# Patient Record
Sex: Male | Born: 1990
Health system: Southern US, Community
[De-identification: ages and names within clinical notes are randomized; demographics above are authoritative.]

## PROBLEM LIST (undated history)

## (undated) DIAGNOSIS — F329 Major depressive disorder, single episode, unspecified: Secondary | ICD-10-CM

## (undated) DIAGNOSIS — F0781 Postconcussional syndrome: Secondary | ICD-10-CM

## (undated) DIAGNOSIS — A072 Cryptosporidiosis: Secondary | ICD-10-CM

## (undated) DIAGNOSIS — F32A Depression, unspecified: Secondary | ICD-10-CM

## (undated) DIAGNOSIS — S0292XA Unspecified fracture of facial bones, initial encounter for closed fracture: Secondary | ICD-10-CM

## (undated) DIAGNOSIS — F17200 Nicotine dependence, unspecified, uncomplicated: Secondary | ICD-10-CM

## (undated) DIAGNOSIS — F419 Anxiety disorder, unspecified: Secondary | ICD-10-CM

## (undated) HISTORY — DX: Nicotine dependence, unspecified, uncomplicated: F17.200

## (undated) HISTORY — DX: Anxiety disorder, unspecified: F41.9

## (undated) HISTORY — DX: Postconcussional syndrome: F07.81

## (undated) HISTORY — DX: Cryptosporidiosis: A07.2

## (undated) HISTORY — DX: Unspecified fracture of facial bones, initial encounter for closed fracture: S02.92XA

## (undated) HISTORY — PX: NASAL SEPTUM SURGERY: SHX37

## (undated) HISTORY — DX: Depression, unspecified: F32.A

## (undated) HISTORY — DX: Major depressive disorder, single episode, unspecified: F32.9

## (undated) SURGERY — PERIPHERAL VASCULAR THROMBECTOMY
Anesthesia: Moderate Sedation | Laterality: Left

---
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
## 2011-03-01 ENCOUNTER — Ambulatory Visit: Payer: Self-pay | Admitting: Urology

## 2011-03-09 ENCOUNTER — Emergency Department: Payer: Self-pay | Admitting: *Deleted

## 2011-03-15 ENCOUNTER — Ambulatory Visit: Payer: Self-pay | Admitting: Pediatrics

## 2011-04-06 ENCOUNTER — Ambulatory Visit (INDEPENDENT_AMBULATORY_CARE_PROVIDER_SITE_OTHER): Payer: BC Managed Care – PPO | Admitting: Psychology

## 2011-04-06 DIAGNOSIS — F411 Generalized anxiety disorder: Secondary | ICD-10-CM

## 2011-04-06 DIAGNOSIS — F331 Major depressive disorder, recurrent, moderate: Secondary | ICD-10-CM

## 2011-04-13 ENCOUNTER — Ambulatory Visit (INDEPENDENT_AMBULATORY_CARE_PROVIDER_SITE_OTHER): Payer: BC Managed Care – PPO | Admitting: Psychology

## 2011-04-13 DIAGNOSIS — F331 Major depressive disorder, recurrent, moderate: Secondary | ICD-10-CM

## 2011-04-13 DIAGNOSIS — F411 Generalized anxiety disorder: Secondary | ICD-10-CM

## 2011-05-11 ENCOUNTER — Ambulatory Visit (INDEPENDENT_AMBULATORY_CARE_PROVIDER_SITE_OTHER): Payer: 59 | Admitting: Psychology

## 2011-05-11 DIAGNOSIS — F331 Major depressive disorder, recurrent, moderate: Secondary | ICD-10-CM

## 2011-05-11 DIAGNOSIS — F411 Generalized anxiety disorder: Secondary | ICD-10-CM

## 2011-05-21 ENCOUNTER — Encounter: Payer: Self-pay | Admitting: Family Medicine

## 2011-05-21 ENCOUNTER — Ambulatory Visit (INDEPENDENT_AMBULATORY_CARE_PROVIDER_SITE_OTHER): Payer: BC Managed Care – PPO | Admitting: Family Medicine

## 2011-05-21 VITALS — BP 116/78 | HR 60 | Temp 98.3°F | Ht 72.0 in | Wt 145.2 lb

## 2011-05-21 DIAGNOSIS — Z9189 Other specified personal risk factors, not elsewhere classified: Secondary | ICD-10-CM

## 2011-05-21 DIAGNOSIS — IMO0001 Reserved for inherently not codable concepts without codable children: Secondary | ICD-10-CM

## 2011-05-21 DIAGNOSIS — F32A Depression, unspecified: Secondary | ICD-10-CM

## 2011-05-21 DIAGNOSIS — F341 Dysthymic disorder: Secondary | ICD-10-CM

## 2011-05-21 DIAGNOSIS — F419 Anxiety disorder, unspecified: Secondary | ICD-10-CM

## 2011-05-21 DIAGNOSIS — F17200 Nicotine dependence, unspecified, uncomplicated: Secondary | ICD-10-CM

## 2011-05-21 DIAGNOSIS — J45909 Unspecified asthma, uncomplicated: Secondary | ICD-10-CM

## 2011-05-21 MED ORDER — ALPRAZOLAM 0.5 MG PO TABS: 0.5000 mg | ORAL_TABLET | Freq: Two times a day (BID) | ORAL | Status: DC | PRN

## 2011-05-21 MED ORDER — CITALOPRAM HYDROBROMIDE 20 MG PO TABS: 20.0000 mg | ORAL_TABLET | Freq: Every day | ORAL | Status: DC

## 2011-05-21 NOTE — Progress Notes (Addendum)
Subjective:    Patient ID: Brian Spence, male    DOB: [birth year], 20 y.o.   MRN: [id number]  HPI CC:new pt establish.  Referral from Dr. Laymond Purser for anxiety.  Previously saw [facility].  Have requested records.  Previously prescribed xanax for anxiety attacks.  Longstanding anxiety - "my whole life" gets nervous easily, diaphoretic, trouble talking to ppl.  + anxiety with going out in public, new places, new people.  + trouble sleeping, over thinks and worries.  When anxiety comes on can go into attack, this happening several times a day, worsening over last several weeks.  Had bad episode Monday - all day.  Trouble sleeping from Sunday night.  Feels restless, heart racing, sweating, chest feels tight, occasional SOB but not as much recently.  Xanax did help in past.  Also endorses longstanding depression.  Has always been shy, quiet.  Low energy level.  Trouble focusing at times.  No HI, + SI in passing but no plan, states would never go through with it.  Contracts for safety  Told has bipolar in past, placed on meds but this caused panic attacks.  Doesn't think this is the case.  States seeing Dr. Laymond Purser - they think more predominant anxiety disorder.  Denies episodes of mania, periods of time without need to sleep or with increased energy or more grandiose, irritable.  Previously saw Dr. Marguerite Olea (pediatrician).  Smoking - started 1 year ago, up to 1/2 ppd.  Currently sexually active, not protection 100% time, 7 partners in last year, currently states monogamous relationship.  No h/o STDs, states checked yearly, last this year 2012 and all negative per patient.  Freshman at [employer].  A's in 2 classes, had to drop 2 classes.  Medications and allergies reviewed and updated in chart.  Past histories reviewed and updated if relevant as below. There is no problem list on file for this patient.  Past Medical History  Diagnosis Date  . Asthma     exercise induced  . Anxiety and  depression    Past Surgical History  Procedure Date  . Shoulder surgery 2009    for seperated shoulder  . Knee arthroscopy w/ plica excision 2010    Right  . Meatotomy 2012    blockage  . Cystoscopy 2012   History  Substance Use Topics  . Smoking status: Current Everyday Smoker -- 0.5 packs/day    Types: Cigarettes  . Smokeless tobacco: Never Used  . Alcohol Use: Yes     Occasional   Family History  Problem Relation Age of Onset  . Schizophrenia Paternal Grandmother   . Bipolar disorder Mother     question  . Coronary artery disease Maternal Grandfather   . Cancer Neg Hx   . Stroke Neg Hx   . Diabetes Neg Hx   . Alcohol abuse Paternal Uncle    No Known Allergies No current outpatient prescriptions on file prior to visit.   Review of Systems  Constitutional: Negative for fever, chills, activity change, appetite change, fatigue and unexpected weight change.  HENT: Negative for hearing loss and neck pain.   Eyes: Negative for visual disturbance.  Respiratory: Positive for cough and chest tightness. Negative for shortness of breath and wheezing.   Cardiovascular: Negative for chest pain, palpitations and leg swelling.  Gastrointestinal: Negative for nausea, vomiting, abdominal pain, diarrhea, constipation, blood in stool and abdominal distention.  Genitourinary: Negative for hematuria and difficulty urinating.  Musculoskeletal: Negative for myalgias and arthralgias.  Skin: Negative  for rash.  Neurological: Positive for headaches (attributes to wisdom teeth). Negative for dizziness, seizures and syncope.  Hematological: Does not bruise/bleed easily.  Psychiatric/Behavioral: Positive for dysphoric mood. The patient is nervous/anxious.       Objective:   Physical Exam  Nursing note and vitals reviewed. Constitutional: He is oriented to person, place, and time. He appears well-developed and well-nourished. No distress.  HENT:  Head: Normocephalic and atraumatic.  Right  Ear: Hearing, tympanic membrane, external ear and ear canal normal.  Left Ear: Hearing, tympanic membrane, external ear and ear canal normal.  Nose: Nose normal. No mucosal edema or rhinorrhea.  Mouth/Throat: Uvula is midline, oropharynx is clear and moist and mucous membranes are normal. No oropharyngeal exudate, posterior oropharyngeal edema, posterior oropharyngeal erythema or tonsillar abscesses.  Eyes: Conjunctivae and EOM are normal. Pupils are equal, round, and reactive to light. No scleral icterus.  Neck: Normal range of motion. Neck supple. No thyromegaly present.  Cardiovascular: Normal rate, regular rhythm, normal heart sounds and intact distal pulses.   No murmur heard. Pulses:      Radial pulses are 2+ on the right side, and 2+ on the left side.  Pulmonary/Chest: Effort normal and breath sounds normal. No respiratory distress. He has no wheezes. He has no rales.  Abdominal: Soft. Bowel sounds are normal. He exhibits no distension and no mass. There is no tenderness. There is no rebound and no guarding.  Musculoskeletal: Normal range of motion.  Lymphadenopathy:    He has no cervical adenopathy.  Neurological: He is alert and oriented to person, place, and time.       CN grossly intact, station and gait intact  Skin: Skin is warm and dry. No rash noted.  Psychiatric: He has a normal mood and affect. His behavior is normal. Judgment and thought content normal.       Assessment & Plan:

## 2011-05-21 NOTE — Patient Instructions (Signed)
Check on meningitis shot as you may be due. questionairres today. I'd like to start you on medicine to help both anxiety and depression (mood issues). This medicine may make you feel worse first few weeks, so have your family keep an eye on you. Xanax while this medicine takes effect. Return to see me in 1 month for follow up. Good to meet you today, call us with questions. Best thing to do for your health is quit smoking.

## 2011-05-22 ENCOUNTER — Encounter: Payer: Self-pay | Admitting: Family Medicine

## 2011-05-22 DIAGNOSIS — IMO0001 Reserved for inherently not codable concepts without codable children: Secondary | ICD-10-CM | POA: Insufficient documentation

## 2011-05-22 DIAGNOSIS — F419 Anxiety disorder, unspecified: Secondary | ICD-10-CM | POA: Insufficient documentation

## 2011-05-22 DIAGNOSIS — J45990 Exercise induced bronchospasm: Secondary | ICD-10-CM | POA: Insufficient documentation

## 2011-05-22 DIAGNOSIS — Z87891 Personal history of nicotine dependence: Secondary | ICD-10-CM | POA: Insufficient documentation

## 2011-05-22 DIAGNOSIS — F32A Depression, unspecified: Secondary | ICD-10-CM | POA: Insufficient documentation

## 2011-05-22 NOTE — Assessment & Plan Note (Signed)
Due for Td 09/01/2015 Seems due for flu and meningitis shot. Advised to check with school if UTD on meningitis shot.  If not, will provide here.

## 2011-05-22 NOTE — Assessment & Plan Note (Signed)
Encouraged cessation.

## 2011-05-22 NOTE — Assessment & Plan Note (Addendum)
PHQ9 = 21/27, very difficult to function per pt GAD7 = 21/21, severe  MDQ = negative.  Only 4/13 for first part. Discussed importance of healthy coping strategies as well as treatment options including. Does seem more consistent with anxiety/depression than bipolar as no h/o manic episodes that I can tell.  Will treat as anxiety/depression with commencement of SSRI, use xanax temporary for anxiety attacks.  Discussed hesitance to use these types of meds long term 2/2 dependence, tolerance, side effects.  Will touch base with Dr. Laymond Purser as well. Discussed in detail SSRI use including side effects as well as chance of sxs worsening prior to improving, specifically possibility of worsening SI, and if that is the case to stop med and call me immediately.  Pt states lives with parents who can keep an eye on him for next several weeks.

## 2011-05-22 NOTE — Assessment & Plan Note (Signed)
Has inhaler at home, doesn't use.

## 2011-06-09 ENCOUNTER — Encounter: Payer: Self-pay | Admitting: Family Medicine

## 2011-06-21 ENCOUNTER — Ambulatory Visit (INDEPENDENT_AMBULATORY_CARE_PROVIDER_SITE_OTHER): Payer: BC Managed Care – PPO | Admitting: Family Medicine

## 2011-06-21 ENCOUNTER — Encounter: Payer: Self-pay | Admitting: Family Medicine

## 2011-06-21 VITALS — BP 112/74 | HR 60 | Temp 98.0°F | Wt 151.2 lb

## 2011-06-21 DIAGNOSIS — J069 Acute upper respiratory infection, unspecified: Secondary | ICD-10-CM | POA: Insufficient documentation

## 2011-06-21 DIAGNOSIS — F32A Depression, unspecified: Secondary | ICD-10-CM

## 2011-06-21 DIAGNOSIS — F329 Major depressive disorder, single episode, unspecified: Secondary | ICD-10-CM

## 2011-06-21 DIAGNOSIS — F410 Panic disorder [episodic paroxysmal anxiety] without agoraphobia: Secondary | ICD-10-CM | POA: Insufficient documentation

## 2011-06-21 DIAGNOSIS — F341 Dysthymic disorder: Secondary | ICD-10-CM

## 2011-06-21 MED ORDER — ALPRAZOLAM 0.5 MG PO TABS: 0.5000 mg | ORAL_TABLET | Freq: Two times a day (BID) | ORAL | Status: DC | PRN

## 2011-06-21 NOTE — Progress Notes (Signed)
  Subjective:    Patient ID: Brian Spence, male    DOB: 31-Dec-1990, 20 y.o.   MRN: [id number]  HPI CC: 1 mo f/u  19yo with longstanding anxiety and depression with panic attacks who was started previous visit on celexa and xanax prn presents for 1 mo f/u.  Feels anxiety/depression getting some better.  Has been taking celexa for 2+ wks only.  Out of xanax, ran out after 3 wks, was taking 0-3/day.  Short term anxiolytic effect noted.  Had increased stress in last few weeks, increased xanax use with this.  Last few weeks have been better.  No panic attacks in the last week.  Has used self calming techniques like slow breathing.  Working well.  Continues CBT with Dr. Laymond Purser.  To schedule f/u with her.  Having several week history of URTI sxs.  No fevers.  Cough productive of mucous, also sinus drainage.  No HA, tooth pain.  + ear discomfort occasionally.  Some chest discomfort with cough.  So far has tried nothing for this.  + ST as well.  Trying to rest, getting plenty of fluid.  Started 2 wks ago.  Girlfriend sick as well.  Congestion/cough worse in am and at night.  More head congestion currently.  Review of Systems Per HPI    Objective:   Physical Exam  Nursing note and vitals reviewed. Constitutional: He appears well-developed and well-nourished. No distress.  HENT:  Head: Normocephalic and atraumatic.  Right Ear: Hearing, tympanic membrane, external ear and ear canal normal.  Left Ear: Hearing, tympanic membrane, external ear and ear canal normal.  Nose: No mucosal edema or rhinorrhea. Right sinus exhibits maxillary sinus tenderness. Right sinus exhibits no frontal sinus tenderness. Left sinus exhibits maxillary sinus tenderness. Left sinus exhibits no frontal sinus tenderness.  Mouth/Throat: Uvula is midline, oropharynx is clear and moist and mucous membranes are normal. No oropharyngeal exudate, posterior oropharyngeal edema, posterior oropharyngeal erythema or tonsillar abscesses.       Mild sinus pressure with palpation Minimal PNDrainage  Eyes: Conjunctivae and EOM are normal. Pupils are equal, round, and reactive to light. No scleral icterus.  Neck: Normal range of motion. Neck supple.  Cardiovascular: Normal rate, regular rhythm, normal heart sounds and intact distal pulses.   No murmur heard. Pulmonary/Chest: Effort normal and breath sounds normal. No respiratory distress. He has no wheezes. He has no rales.  Lymphadenopathy:    He has no cervical adenopathy.  Neurological: He is alert.  Skin: Skin is warm and dry. No rash noted.  Psychiatric: He has a normal mood and affect. His behavior is normal. Judgment and thought content normal.       Assessment & Plan:

## 2011-06-21 NOTE — Assessment & Plan Note (Signed)
With some agoraphobia. Improved on celexa, xanax prn. See above.  RTC 6 wks for f/u.

## 2011-06-21 NOTE — Patient Instructions (Signed)
Continue celexa at 20mg  for now.  Let me know if in 2 weeks I'm glad things are going well. Return in 4-6 weeks for follow up I think you have viral upper respiratory infection. Try simple mucinex with plenty of fluid to help mobilize mucous out. Let me know if fever >101, worsening cough or trouble breathing, or worsening sinus congestion.

## 2011-06-21 NOTE — Assessment & Plan Note (Signed)
Going on about 2wks. Could be early developing bronchitis,sinusitis but exam benign today. Good O2 sat, no wheezing on lung exam today. supportive care for now, to call me if not improving as expected, fever >101, or worsening cough/sinus HA.

## 2011-06-21 NOTE — Assessment & Plan Note (Signed)
Improved on celexa QD, xanax prn. Discussed to give SSRI more time as only has been on this 2+ wks. overall doing well. PHQ9 = 7 (from 21 last visit), somewhat difficult to function. GAD7 = 10 (from 21 last visit) Continue CBT with Dr. Laymond Purser. Encouraged try to minimize xanax use.

## 2011-08-27 DIAGNOSIS — F0781 Postconcussional syndrome: Secondary | ICD-10-CM

## 2011-08-27 HISTORY — DX: Postconcussional syndrome: F07.81

## 2011-09-11 ENCOUNTER — Emergency Department: Payer: Self-pay | Admitting: Emergency Medicine

## 2011-09-14 ENCOUNTER — Ambulatory Visit (INDEPENDENT_AMBULATORY_CARE_PROVIDER_SITE_OTHER)
Admission: RE | Admit: 2011-09-14 | Discharge: 2011-09-14 | Disposition: A | Discharge status: Home / Self Care | Payer: BC Managed Care – PPO | Source: Ambulatory Visit | Attending: Family Medicine | Admitting: Family Medicine

## 2011-09-14 ENCOUNTER — Encounter: Payer: Self-pay | Admitting: Family Medicine

## 2011-09-14 ENCOUNTER — Ambulatory Visit (INDEPENDENT_AMBULATORY_CARE_PROVIDER_SITE_OTHER): Payer: BC Managed Care – PPO | Admitting: Family Medicine

## 2011-09-14 VITALS — BP 122/76 | HR 72 | Temp 98.7°F | Wt 148.0 lb

## 2011-09-14 DIAGNOSIS — M79645 Pain in left finger(s): Secondary | ICD-10-CM

## 2011-09-14 DIAGNOSIS — M79609 Pain in unspecified limb: Secondary | ICD-10-CM

## 2011-09-14 DIAGNOSIS — S060XAA Concussion with loss of consciousness status unknown, initial encounter: Secondary | ICD-10-CM

## 2011-09-14 DIAGNOSIS — F341 Dysthymic disorder: Secondary | ICD-10-CM

## 2011-09-14 DIAGNOSIS — F419 Anxiety disorder, unspecified: Secondary | ICD-10-CM

## 2011-09-14 DIAGNOSIS — S060X9A Concussion with loss of consciousness of unspecified duration, initial encounter: Secondary | ICD-10-CM

## 2011-09-14 DIAGNOSIS — R109 Unspecified abdominal pain: Secondary | ICD-10-CM | POA: Insufficient documentation

## 2011-09-14 DIAGNOSIS — F0781 Postconcussional syndrome: Secondary | ICD-10-CM | POA: Insufficient documentation

## 2011-09-14 DIAGNOSIS — F32A Depression, unspecified: Secondary | ICD-10-CM

## 2011-09-14 MED ORDER — NAPROXEN 500 MG PO TABS: ORAL_TABLET | ORAL | Status: DC

## 2011-09-14 MED ORDER — CYCLOBENZAPRINE HCL 5 MG PO TABS: 5.0000 mg | ORAL_TABLET | Freq: Two times a day (BID) | ORAL | Status: DC | PRN

## 2011-09-14 MED ORDER — CITALOPRAM HYDROBROMIDE 20 MG PO TABS: 20.0000 mg | ORAL_TABLET | Freq: Every day | ORAL | Status: DC

## 2011-09-14 MED ORDER — ALPRAZOLAM 0.5 MG PO TABS: 0.5000 mg | ORAL_TABLET | Freq: Two times a day (BID) | ORAL | Status: DC | PRN

## 2011-09-14 NOTE — Assessment & Plan Note (Signed)
Check H pylori, consider rec trial H2 blocker vs PPI
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
## 2011-09-14 NOTE — Assessment & Plan Note (Signed)
Mild.  Continued headache and mild dizziness.  CT head from ER reviewed.  Neurological exam benign today. Discussed brain rest for next few days, slowly increasing activity as tolerated.

## 2011-09-14 NOTE — Assessment & Plan Note (Signed)
Refilled celexa x 6 mo, rtc 4 mo for f/u. Refilled xanax, avised to use sparingly, hopeful to come off with better anxiety control.
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
## 2011-09-22 ENCOUNTER — Encounter: Payer: Self-pay | Admitting: *Deleted

## 2011-09-22 ENCOUNTER — Ambulatory Visit (INDEPENDENT_AMBULATORY_CARE_PROVIDER_SITE_OTHER): Payer: BC Managed Care – PPO | Admitting: Family Medicine

## 2011-09-22 ENCOUNTER — Encounter: Payer: Self-pay | Admitting: Family Medicine

## 2011-09-22 DIAGNOSIS — R109 Unspecified abdominal pain: Secondary | ICD-10-CM

## 2011-09-22 DIAGNOSIS — F0781 Postconcussional syndrome: Secondary | ICD-10-CM

## 2011-09-22 NOTE — Assessment & Plan Note (Signed)
Sounds more like bloating/indigestion and gas. Discussed foods to avoid as well as start trial of gas x with meals, zantac as well. Update me if not improving H pylori negative last visit 08/2011.
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
## 2011-09-22 NOTE — Assessment & Plan Note (Signed)
Manifesting as nausea/dizziness and some impaired memory. DOI 09/11/2011.  Head CT WNL at [facility] ER. No HA to be concerned for subdural. Reassured, continue brain rest and physical activity rest. rtc 2-3 wks for f/u.  If not improving, consder referral to neurology.

## 2011-09-22 NOTE — Patient Instructions (Addendum)
I think you have post concussion syndrome. Rest brain - no physical activity for 1 week.  No significant TV more than 1-2 hours/day, no studying, not a lot of cellphone or texting or video games.   Then slowly build up activity. For abdomen - trial of gas x with meals and trial of znatac 150mg  twice daily - both are over the counter. Exclude gas producing foods (beans, onions, celery, carrots, raisins, bananas, apricots, prunes, brussel sprouts, wheat germ, pretzels) Consider trail of lactose free diet (back off milk and cheese) for abdomen. Return to see me in 2-3 weeks for follow up. Out of work for 1 week then light duty (no heavy lifting) for 1 week.  Post-Concussion Syndrome Post-concussion syndrome describes the symptoms that can occur after a head injury. These symptoms can last from weeks to months. CAUSES  It is not clear why some head injuries cause post-concussion syndrome. It can occur whether your head injury was mild or severe and whether you were wearing head protection or not.  SYMPTOMS  Memory difficulties.   Dizziness.   Headaches.   Double vision or blurry vision.   Sensitivity to light.   Hearing difficulties.   Depression.   Tiredness.   Weakness.   Difficulty with concentration.   Difficulty sleeping or staying asleep.   Vomiting.  DIAGNOSIS  There is no test to determine whether you have post-concussion syndrome. Your caregiver may order an imaging scan of your brain, such as a CT scan, to check for other problems that may be causing your symptoms (such as severe injury inside your skull). TREATMENT  Usually, these problems disappear over time without medical care. Your caregiver may prescribe medicine to help ease your symptoms. It is important to follow up with a neurologist to evaluate your recovery and address any lingering symptoms or issues. HOME CARE INSTRUCTIONS   Only take over-the-counter or prescription medicines for pain, discomfort, or  fever as directed by your caregiver. Do not take aspirin. Aspirin can slow blood clotting.   Sleep with your head slightly elevated to help with headaches.   Avoid any situation where there is potential for another head injury (football, hockey, martial arts, horseback riding). Your condition will get worse every time you experience a concussion. You should avoid these activities until you are evaluated by the appropriate follow-up caregivers.   Keep all follow-up appointments as directed by your caregiver.  SEEK IMMEDIATE MEDICAL CARE IF:  You develop confusion or unusual drowsiness.   You cannot wake the injured person.   You develop nausea or persistent, forceful vomiting.   You feel like you are moving when you are not (vertigo).   You notice the injured person's eyes moving rapidly back and forth. This may be a sign of vertigo.   You have convulsions or faint.   You have severe, persistent headaches that are not relieved by medicine.   You cannot use your arms or legs normally.   Your pupils change size.   You have clear or bloody discharge from the nose or ears.   Your problems are getting worse, not better.  MAKE SURE YOU:  Understand these instructions.   Will watch your condition.   Will get help right away if you are not doing well or get worse.  Document Released: 01/01/2002 Document Revised: 03/24/2011 Document Reviewed: 01/28/2011 [facility] Patient Information 2012 [place], [place].

## 2012-03-17 ENCOUNTER — Ambulatory Visit (INDEPENDENT_AMBULATORY_CARE_PROVIDER_SITE_OTHER): Payer: BC Managed Care – PPO | Admitting: Family Medicine

## 2012-03-17 ENCOUNTER — Encounter: Payer: Self-pay | Admitting: Family Medicine

## 2012-03-17 VITALS — BP 116/78 | HR 84 | Temp 98.2°F | Wt 144.8 lb

## 2012-03-17 DIAGNOSIS — F0781 Postconcussional syndrome: Secondary | ICD-10-CM

## 2012-03-17 DIAGNOSIS — G4484 Primary exertional headache: Secondary | ICD-10-CM

## 2012-03-17 DIAGNOSIS — R079 Chest pain, unspecified: Secondary | ICD-10-CM

## 2012-03-17 DIAGNOSIS — R51 Headache: Secondary | ICD-10-CM

## 2012-03-17 DIAGNOSIS — R55 Syncope and collapse: Secondary | ICD-10-CM | POA: Insufficient documentation

## 2012-03-17 DIAGNOSIS — F410 Panic disorder [episodic paroxysmal anxiety] without agoraphobia: Secondary | ICD-10-CM

## 2012-03-17 MED ORDER — ALPRAZOLAM 0.25 MG PO TABS: 0.2500 mg | ORAL_TABLET | Freq: Two times a day (BID) | ORAL | Status: DC | PRN

## 2012-03-17 MED ORDER — CITALOPRAM HYDROBROMIDE 10 MG PO TABS: 10.0000 mg | ORAL_TABLET | Freq: Every day | ORAL | Status: DC

## 2012-03-17 NOTE — Patient Instructions (Addendum)
EKG overall looking ok. I'd like to start you back on celexa - start at 10mg  daily - sent in refill. Xanax for next three months while celexa achieves good level. Pass by up front to schedule appointment with neurology. avoid exhertion until you see neurologist.
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
## 2012-03-17 NOTE — Assessment & Plan Note (Signed)
Start celexa at 10mg  daily, restart xanax prn panic attacks. Discussed again temporary use of xanax, hopeful to stop within 3 mo.
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
## 2012-03-17 NOTE — Assessment & Plan Note (Signed)
See above

## 2012-03-17 NOTE — Assessment & Plan Note (Signed)
EKG today overall stable.  Doubt cardiac issue. EKG - sinus arrhythmia at rate of 60-70s, normal axis, intervals, no ST/T changes

## 2012-04-04 ENCOUNTER — Ambulatory Visit: Payer: Self-pay | Admitting: Neurology

## 2012-07-31 ENCOUNTER — Encounter: Payer: Self-pay | Admitting: Family Medicine

## 2012-07-31 ENCOUNTER — Ambulatory Visit (INDEPENDENT_AMBULATORY_CARE_PROVIDER_SITE_OTHER): Payer: BC Managed Care – PPO | Admitting: Family Medicine

## 2012-07-31 ENCOUNTER — Ambulatory Visit (INDEPENDENT_AMBULATORY_CARE_PROVIDER_SITE_OTHER)
Admission: RE | Admit: 2012-07-31 | Discharge: 2012-07-31 | Disposition: A | Discharge status: Home / Self Care | Payer: BC Managed Care – PPO | Source: Ambulatory Visit | Attending: Family Medicine | Admitting: Family Medicine

## 2012-07-31 VITALS — BP 112/72 | HR 50 | Temp 98.0°F | Wt 151.8 lb

## 2012-07-31 DIAGNOSIS — S6991XA Unspecified injury of right wrist, hand and finger(s), initial encounter: Secondary | ICD-10-CM

## 2012-07-31 DIAGNOSIS — F32A Depression, unspecified: Secondary | ICD-10-CM

## 2012-07-31 DIAGNOSIS — F341 Dysthymic disorder: Secondary | ICD-10-CM

## 2012-07-31 DIAGNOSIS — F419 Anxiety disorder, unspecified: Secondary | ICD-10-CM

## 2012-07-31 DIAGNOSIS — S6990XA Unspecified injury of unspecified wrist, hand and finger(s), initial encounter: Secondary | ICD-10-CM

## 2012-07-31 MED ORDER — CITALOPRAM HYDROBROMIDE 10 MG PO TABS: 10.0000 mg | ORAL_TABLET | Freq: Every day | ORAL | Status: DC

## 2012-07-31 MED ORDER — ALPRAZOLAM 0.25 MG PO TABS: 0.2500 mg | ORAL_TABLET | Freq: Two times a day (BID) | ORAL | Status: AC | PRN

## 2012-07-31 NOTE — Assessment & Plan Note (Signed)
Requests to restart celexa and xanax while starting SSRI. Refilled both.

## 2012-07-31 NOTE — Assessment & Plan Note (Signed)
Concern given h/o finger fracture 5 yrs ago. Checked xrays today of finger and hand - no evident acute fracture. Anticipate R 5th PIP sprain. Supportive care as per instructions - recommended no heavy lifting for next week, may use pain med like ibuprofen, ice and elevate hand. If not much improved in 2 wks, recommended return for re eval.
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
## 2012-07-31 NOTE — Patient Instructions (Addendum)
xrays today. Let's continue using buddy tape. No heavy lifting for next 1 week. Ice hand. Use aleve or ibuprofen for hand swelling.

## 2012-12-07 ENCOUNTER — Encounter: Payer: Self-pay | Admitting: Family Medicine

## 2012-12-07 ENCOUNTER — Ambulatory Visit (INDEPENDENT_AMBULATORY_CARE_PROVIDER_SITE_OTHER): Payer: BC Managed Care – PPO | Admitting: Family Medicine

## 2012-12-07 VITALS — BP 118/78 | HR 60 | Temp 98.1°F | Wt 157.0 lb

## 2012-12-07 DIAGNOSIS — M79609 Pain in unspecified limb: Secondary | ICD-10-CM

## 2012-12-07 DIAGNOSIS — M79602 Pain in left arm: Secondary | ICD-10-CM | POA: Insufficient documentation

## 2012-12-07 DIAGNOSIS — F32A Depression, unspecified: Secondary | ICD-10-CM

## 2012-12-07 DIAGNOSIS — F329 Major depressive disorder, single episode, unspecified: Secondary | ICD-10-CM

## 2012-12-07 DIAGNOSIS — J45990 Exercise induced bronchospasm: Secondary | ICD-10-CM

## 2012-12-07 DIAGNOSIS — F341 Dysthymic disorder: Secondary | ICD-10-CM

## 2012-12-07 MED ORDER — ALBUTEROL SULFATE HFA 108 (90 BASE) MCG/ACT IN AERS: 2.0000 | INHALATION_SPRAY | Freq: Four times a day (QID) | RESPIRATORY_TRACT | Status: DC | PRN

## 2012-12-07 MED ORDER — CITALOPRAM HYDROBROMIDE 10 MG PO TABS: 10.0000 mg | ORAL_TABLET | Freq: Every day | ORAL | Status: DC
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
## 2012-12-07 NOTE — Assessment & Plan Note (Signed)
With h/o panic attacks.  Restart celexa.

## 2012-12-07 NOTE — Assessment & Plan Note (Signed)
L elbow- anticipate golfer's elbow. Discussed this. Treat with NSAID and exercises from [facility] pt advisor on medial epicondylitis. L shoulder - anticipate chronic RTC injury, likely subscapularis strain.  Treat with NSAIDs, ice, and exercises from [facility] pt advisor on RTC injury. To return for further eval if not improving as expected or any worsening

## 2012-12-07 NOTE — Patient Instructions (Signed)
Albuterol refilled. Restart celexa for anxiety. I think you have medial epicondylitis and some inflammation of left rotator cuff (exercises provided) Ice to shoulder Use ibupofen 400mg  twice daily with food for next 5 days then only as needed. Back off heavy lifting for the next 1-2 weeks while you're doing exercises provided today.

## 2012-12-07 NOTE — Assessment & Plan Note (Signed)
Refilled albuterol 

## 2013-01-09 ENCOUNTER — Ambulatory Visit (INDEPENDENT_AMBULATORY_CARE_PROVIDER_SITE_OTHER): Payer: BC Managed Care – PPO | Admitting: Family Medicine

## 2013-01-09 ENCOUNTER — Encounter: Payer: Self-pay | Admitting: Family Medicine

## 2013-01-09 VITALS — BP 114/78 | HR 72 | Temp 98.2°F | Wt 153.8 lb

## 2013-01-09 DIAGNOSIS — F341 Dysthymic disorder: Secondary | ICD-10-CM

## 2013-01-09 DIAGNOSIS — F419 Anxiety disorder, unspecified: Secondary | ICD-10-CM

## 2013-01-09 DIAGNOSIS — F32A Depression, unspecified: Secondary | ICD-10-CM

## 2013-01-09 MED ORDER — SERTRALINE HCL 25 MG PO TABS: 25.0000 mg | ORAL_TABLET | Freq: Every day | ORAL | Status: DC

## 2013-01-09 NOTE — Assessment & Plan Note (Signed)
Anticipate some of today's sxs due to celexa - will stop and change over to sertaline. Discussed taper with patient who agrees with plan. Return as needed, update me if sxs continue.
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
## 2013-01-09 NOTE — Patient Instructions (Signed)
I wonder if celexa is causing some of these symptoms. Let's stop celexa today.  May start sertraline 25mg  daily tomorrow. Give me an update with how you feel on the new medicine.

## 2013-01-10 ENCOUNTER — Telehealth: Payer: Self-pay

## 2013-01-10 NOTE — Telephone Encounter (Signed)
Dr Para March said for pt to stop Zoloft; forward note to Dr Sharen Hones and Dr Sharen Hones assistant will call back with further instructions(could be tomorrow or this afternoon). Advised pt if condition changes or worsens to go to ED for evaluation; pt said he did not know if he needed to go to ED or not; pt said pain in head was not as bad as when started 30 mins ago; pain level now 7. Pt is at work and will wait awhile and decide if needs eval today; if so pt will go to ED. Pt has no weakness in arms or legs and no speech problem. Pt said rt arm at elbow is cramping but has had that before. Again advised pt to stop zoloft and if condition changes or worsens to go to ED. Pt voiced understanding.

## 2013-01-10 NOTE — Telephone Encounter (Signed)
Pt calling after starting zoloft today at 1 pm; pt said 30 mins ago started with severe pain on back of head right side,dizziness,slight SOB,blurred vision. No chest pain. Pt wants to know if could be side effect to zoloft. Should pt continue taking zoloft.

## 2013-01-10 NOTE — Telephone Encounter (Signed)
Noted, will route to PCP.  Thanks to all.

## 2013-01-11 NOTE — Telephone Encounter (Signed)
Agree to stop zoloft - we may re-try celexa at a lower dose, or we may try 3rd antidepressant from a different class (effexor).

## 2013-01-11 NOTE — Telephone Encounter (Signed)
Attempted to call patient. No answer and no VM. Will try again later.

## 2013-01-16 NOTE — Telephone Encounter (Signed)
Spoke with patient. He is out of town right now and said he would think about what he wants to do and call back with a decision when he gets back in town probably next week.

## 2013-02-08 ENCOUNTER — Encounter: Payer: Self-pay | Admitting: Family Medicine

## 2013-02-08 ENCOUNTER — Ambulatory Visit (INDEPENDENT_AMBULATORY_CARE_PROVIDER_SITE_OTHER): Payer: BC Managed Care – PPO | Admitting: Family Medicine

## 2013-02-08 VITALS — BP 110/78 | HR 80 | Temp 98.4°F | Wt 153.5 lb

## 2013-02-08 DIAGNOSIS — J45990 Exercise induced bronchospasm: Secondary | ICD-10-CM

## 2013-02-08 DIAGNOSIS — F32A Depression, unspecified: Secondary | ICD-10-CM

## 2013-02-08 DIAGNOSIS — F419 Anxiety disorder, unspecified: Secondary | ICD-10-CM

## 2013-02-08 DIAGNOSIS — F329 Major depressive disorder, single episode, unspecified: Secondary | ICD-10-CM

## 2013-02-08 DIAGNOSIS — R55 Syncope and collapse: Secondary | ICD-10-CM

## 2013-02-08 DIAGNOSIS — F341 Dysthymic disorder: Secondary | ICD-10-CM

## 2013-02-08 MED ORDER — VENLAFAXINE HCL ER 37.5 MG PO CP24: 37.5000 mg | ORAL_CAPSULE | Freq: Every day | ORAL | Status: DC

## 2013-02-08 MED ORDER — HYDROXYZINE HCL 25 MG PO TABS: 25.0000 mg | ORAL_TABLET | Freq: Three times a day (TID) | ORAL | Status: DC | PRN
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
## 2013-02-08 NOTE — Patient Instructions (Addendum)
EKG today. Blood work today. Prescription for low dose effexor to try when you're at home for several week trial to see if anxiety improves. Return 1 month after starting effexor. May use hydroxyzine 1/2 to 1 tablet as needed for anxiety attack. Good to see you today, call us with questions.

## 2013-02-09 ENCOUNTER — Encounter: Payer: Self-pay | Admitting: Family Medicine

## 2013-02-09 LAB — BASIC METABOLIC PANEL
Calcium: 9.2 mg/dL (ref 8.4–10.5)
GFR: 110.84 mL/min (ref >60.00)
Potassium: 4.1 mEq/L (ref 3.5–5.1)
Sodium: 137 mEq/L (ref 135–145)

## 2013-02-09 LAB — CBC WITH DIFFERENTIAL/PLATELET
Basophils Absolute: 0.1 10*3/uL (ref 0.0–0.1)
Basophils Relative: 0.9 % (ref 0.0–3.0)
Eosinophils Relative: 1.7 % (ref 0.0–5.0)
Hemoglobin: 14.5 g/dL (ref 13.0–17.0)
Lymphocytes Relative: 33.3 % (ref 12.0–46.0)
Monocytes Relative: 9.9 % (ref 3.0–12.0)
Neutro Abs: 3.7 10*3/uL (ref 1.4–7.7)
RBC: 4.58 Mil/uL (ref 4.22–5.81)
RDW: 13.1 % (ref 11.5–14.6)
WBC: 6.9 10*3/uL (ref 4.5–10.5)

## 2013-02-09 LAB — TSH: TSH: 1.13 u[IU]/mL (ref 0.35–5.50)

## 2013-02-09 NOTE — Assessment & Plan Note (Addendum)
Rechecked EKG today.  Prodromal phase prior to syncope - ?vasovagal.  EKG: NSR rate 50s, normal axis, intervals, no hypertrophy or acute ST/T changes, unchanged from EKG 2013. Possibly anxiety related, vs other.   If persistent, refer to cards for further evaluation. Check reversible blood work today (TSH, CBC, BMP)

## 2013-02-09 NOTE — Assessment & Plan Note (Signed)
Rare albuterol use

## 2013-02-09 NOTE — Assessment & Plan Note (Addendum)
Recently worsening with anxiety attacks. Has not tolerated celexa or zoloft so far.   Recommended trial of effexor - may start when he returns from work. Has declined f/u with Dr. Laymond Purser. Provided today with atarax prn anxiety - has been on xanax in past.

## 2013-02-12 ENCOUNTER — Encounter: Payer: Self-pay | Admitting: *Deleted

## 2013-02-16 ENCOUNTER — Encounter: Payer: Self-pay | Admitting: Family Medicine

## 2013-02-16 ENCOUNTER — Ambulatory Visit (INDEPENDENT_AMBULATORY_CARE_PROVIDER_SITE_OTHER): Payer: BC Managed Care – PPO | Admitting: Family Medicine

## 2013-02-16 ENCOUNTER — Encounter: Payer: Self-pay | Admitting: *Deleted

## 2013-02-16 VITALS — BP 116/64 | HR 68 | Temp 98.1°F | Wt 151.8 lb

## 2013-02-16 DIAGNOSIS — IMO0002 Reserved for concepts with insufficient information to code with codable children: Secondary | ICD-10-CM

## 2013-02-16 DIAGNOSIS — F419 Anxiety disorder, unspecified: Secondary | ICD-10-CM

## 2013-02-16 DIAGNOSIS — F341 Dysthymic disorder: Secondary | ICD-10-CM

## 2013-02-16 DIAGNOSIS — T304 Corrosion of unspecified body region, unspecified degree: Secondary | ICD-10-CM | POA: Insufficient documentation

## 2013-02-16 DIAGNOSIS — T300 Burn of unspecified body region, unspecified degree: Secondary | ICD-10-CM

## 2013-02-16 DIAGNOSIS — F32A Depression, unspecified: Secondary | ICD-10-CM

## 2013-02-16 MED ORDER — BACITRACIN-NEOMYCIN-POLYMYXIN 400-5-5000 EX OINT: TOPICAL_OINTMENT | Freq: Two times a day (BID) | CUTANEOUS | Status: DC

## 2013-02-16 MED ORDER — BUSPIRONE HCL 5 MG PO TABS: 5.0000 mg | ORAL_TABLET | Freq: Every day | ORAL | Status: DC

## 2013-02-16 MED ORDER — ALBUTEROL SULFATE HFA 108 (90 BASE) MCG/ACT IN AERS: 2.0000 | INHALATION_SPRAY | Freq: Four times a day (QID) | RESPIRATORY_TRACT | Status: DC | PRN
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
## 2013-02-16 NOTE — Assessment & Plan Note (Signed)
Has not started venlafaxine hydroxyzine not effective for prn anxiety. Will do trial of buspar, stop venlafaxine. Start low given prior sensitivity to meds. Update if not responding, would likely restart venlafaxine and PRN benzo
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
## 2013-03-27 ENCOUNTER — Telehealth: Payer: Self-pay

## 2013-04-20 ENCOUNTER — Encounter: Payer: Self-pay | Admitting: Family Medicine

## 2013-04-20 ENCOUNTER — Ambulatory Visit (INDEPENDENT_AMBULATORY_CARE_PROVIDER_SITE_OTHER): Payer: BC Managed Care – PPO | Admitting: Family Medicine

## 2013-04-20 VITALS — BP 110/76 | HR 76 | Temp 98.2°F | Ht 72.0 in | Wt 161.2 lb

## 2013-04-20 DIAGNOSIS — J020 Streptococcal pharyngitis: Secondary | ICD-10-CM

## 2013-04-20 DIAGNOSIS — J029 Acute pharyngitis, unspecified: Secondary | ICD-10-CM

## 2013-04-20 MED ORDER — AMOXICILLIN 500 MG PO CAPS: 1000.0000 mg | ORAL_CAPSULE | Freq: Two times a day (BID) | ORAL | Status: DC

## 2013-04-20 NOTE — Progress Notes (Signed)
  Subjective:    Patient ID: Brian Spence, male    DOB: 1990/12/13, 22 y.o.   MRN: [id number]  Sore Throat  This is a new problem. The current episode started in the past 7 days (2-3). The problem has been gradually worsening. Neither side of throat is experiencing more pain than the other. There has been no fever. The pain is moderate. Associated symptoms include a plugged ear sensation. Pertinent negatives include no drooling, shortness of breath or trouble swallowing. Associated symptoms comments: Right ear  post nasal drip, fatigued, decreased appetite. He has had no exposure to strep or mono. He has tried nothing for the symptoms. The treatment provided no relief.   Hx of exercise induced asthma.. Has use albuterol occ with this   He smokes ecigarettes now, former smoker   Review of Systems  HENT: Negative for drooling and trouble swallowing.   Respiratory: Negative for shortness of breath.        Objective:   Physical Exam  Constitutional: Vital signs are normal. He appears well-developed and well-nourished.  Non-toxic appearance. He does not appear ill. No distress.  HENT:  Head: Normocephalic and atraumatic.  Right Ear: Hearing, tympanic membrane, external ear and ear canal normal. No tenderness. No foreign bodies. Tympanic membrane is not retracted and not bulging.  Left Ear: Hearing, tympanic membrane, external ear and ear canal normal. No tenderness. No foreign bodies. Tympanic membrane is not retracted and not bulging.  Nose: Nose normal. No mucosal edema or rhinorrhea. Right sinus exhibits no maxillary sinus tenderness and no frontal sinus tenderness. Left sinus exhibits no maxillary sinus tenderness and no frontal sinus tenderness.  Mouth/Throat: Uvula is midline and mucous membranes are normal. Normal dentition. No dental caries. Oropharyngeal exudate, posterior oropharyngeal edema and posterior oropharyngeal erythema present. No tonsillar abscesses.  Eyes:  Conjunctivae, EOM and lids are normal. Pupils are equal, round, and reactive to light. Lids are everted and swept, no foreign bodies found.  Neck: Trachea normal, normal range of motion and phonation normal. Neck supple. Carotid bruit is not present. No mass and no thyromegaly present.  Cardiovascular: Normal rate, regular rhythm, S1 normal, S2 normal, normal heart sounds, intact distal pulses and normal pulses.  Exam reveals no gallop.   No murmur heard. Pulmonary/Chest: Effort normal and breath sounds normal. No respiratory distress. He has no wheezes. He has no rhonchi. He has no rales.  Abdominal: Soft. Normal appearance and bowel sounds are normal. There is no hepatosplenomegaly. There is no tenderness. There is no rebound, no guarding and no CVA tenderness. No hernia.  Neurological: He is alert. He has normal reflexes.  Skin: Skin is warm, dry and intact. No rash noted.  Psychiatric: He has a normal mood and affect. His speech is normal and behavior is normal. Judgment normal.          Assessment & Plan:

## 2013-04-20 NOTE — Assessment & Plan Note (Signed)
Treat with antibiotics. Symptomatic care. Call if not improving in 48 hours... Go to ER if airway compromise.

## 2013-04-20 NOTE — Patient Instructions (Addendum)
Complete antibiotics. You will remain contagious until 24 hours of the antibiotics. Ibuprofen 800 mg every 8 hours for pain or fever. Call if throat not improving some after 48 hours of antibiotics , new fever or if pain increases on one side versus the other. Call sooner if worsening. Go to ER if you cannot breath or if throat feels like swelling is worsening.

## 2013-05-01 ENCOUNTER — Encounter: Payer: Self-pay | Admitting: Family Medicine

## 2013-05-01 ENCOUNTER — Encounter: Payer: Self-pay | Admitting: *Deleted

## 2013-05-01 ENCOUNTER — Ambulatory Visit (INDEPENDENT_AMBULATORY_CARE_PROVIDER_SITE_OTHER): Payer: BC Managed Care – PPO | Admitting: Family Medicine

## 2013-05-01 VITALS — BP 118/64 | HR 68 | Temp 98.2°F | Wt 166.5 lb

## 2013-05-01 DIAGNOSIS — W57XXXA Bitten or stung by nonvenomous insect and other nonvenomous arthropods, initial encounter: Secondary | ICD-10-CM | POA: Insufficient documentation

## 2013-05-01 DIAGNOSIS — J029 Acute pharyngitis, unspecified: Secondary | ICD-10-CM

## 2013-05-01 DIAGNOSIS — F32A Depression, unspecified: Secondary | ICD-10-CM

## 2013-05-01 DIAGNOSIS — F341 Dysthymic disorder: Secondary | ICD-10-CM

## 2013-05-01 DIAGNOSIS — T148 Other injury of unspecified body region: Secondary | ICD-10-CM

## 2013-05-01 DIAGNOSIS — J019 Acute sinusitis, unspecified: Secondary | ICD-10-CM | POA: Insufficient documentation

## 2013-05-01 DIAGNOSIS — F329 Major depressive disorder, single episode, unspecified: Secondary | ICD-10-CM

## 2013-05-01 LAB — POCT RAPID STREP A (OFFICE): Rapid Strep A Screen: NEGATIVE

## 2013-05-01 MED ORDER — GUAIFENESIN-CODEINE 100-10 MG/5ML PO SYRP: 5.0000 mL | ORAL_SOLUTION | Freq: Every evening | ORAL | Status: DC | PRN

## 2013-05-01 MED ORDER — BUSPIRONE HCL 5 MG PO TABS: 5.0000 mg | ORAL_TABLET | Freq: Every day | ORAL | Status: DC

## 2013-05-01 MED ORDER — AZITHROMYCIN 250 MG PO TABS: ORAL_TABLET | ORAL | Status: DC

## 2013-05-01 NOTE — Assessment & Plan Note (Signed)
buspar working well - refilled.
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
## 2013-05-01 NOTE — Patient Instructions (Signed)
I think you have sinus infection on top of recently treated strep throat.   Treat with zpack - and may use cheratussin cough syrup for night time. Push fluids, plenty of rest, ibuprofen for discomfort. Watch for fever >101, worsening productive cough or not improving as expected. I've refilled buspar.

## 2013-05-01 NOTE — Assessment & Plan Note (Signed)
Anticipate sinusitis developed after strep throat. Treat with zpack.  Has completed amoxicillin course. Update if sxs persist. cheratussin for cough at night - sedation precautions discussed.

## 2013-05-01 NOTE — Assessment & Plan Note (Signed)
Check lyme disease titers.

## 2013-05-03 ENCOUNTER — Encounter: Payer: Self-pay | Admitting: *Deleted

## 2013-05-17 ENCOUNTER — Encounter: Payer: Self-pay | Admitting: Family Medicine

## 2013-05-17 ENCOUNTER — Encounter: Payer: Self-pay | Admitting: *Deleted

## 2013-05-17 ENCOUNTER — Ambulatory Visit (INDEPENDENT_AMBULATORY_CARE_PROVIDER_SITE_OTHER): Payer: BC Managed Care – PPO | Admitting: Family Medicine

## 2013-05-17 VITALS — BP 108/76 | HR 63 | Temp 98.1°F | Wt 163.8 lb

## 2013-05-17 DIAGNOSIS — R109 Unspecified abdominal pain: Secondary | ICD-10-CM | POA: Insufficient documentation

## 2013-05-17 LAB — POCT URINALYSIS DIPSTICK
Bilirubin, UA: NEGATIVE
Glucose, UA: NEGATIVE
Ketones, UA: NEGATIVE
Spec Grav, UA: 1.01
Urobilinogen, UA: 0.2

## 2013-05-17 NOTE — Assessment & Plan Note (Addendum)
Generalized abd discomfort (mainly localized epigastric region associated with nausea, few episodes of vomiting last week, and bowel changes, in setting of recent antibiotic use. Anticipate abx related GI upset.  Treat with zantac, align.  Bland diet. Nontoxic today, well hydrated. UA today - trace blood and wbc, but micro WNL.  rec avoid caffeine and other bladder irritants. Discussed this - advised if persistent sxs, to return for hemoccult (pt declines today), further evaluation.  Pt agrees with plan. No persistent diarrhea so did not check c diff. Doubt inflammatory bowel disease given short duration of symptoms.
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
## 2013-05-17 NOTE — Patient Instructions (Addendum)
Let's stop immodium. I think the 2 antibiotics you have recently been on have caused GI imbalance/upset. Start zantac 150mg  nightly or up to twice a day for next 10 days for stomach protection. Start align or [place] daily for next 1-2 weeks for healthy gut flora. Urine checked today - looking ok. bland diet - avoid bladder irritants like caffeine.  Increase water. If not improving with this, or you notice any new blood in stool, please return to see me for further evaluation.

## 2013-05-18 ENCOUNTER — Ambulatory Visit: Payer: Self-pay | Admitting: Family Medicine

## 2013-05-25 ENCOUNTER — Ambulatory Visit (INDEPENDENT_AMBULATORY_CARE_PROVIDER_SITE_OTHER): Payer: BC Managed Care – PPO | Admitting: Family Medicine

## 2013-05-25 ENCOUNTER — Encounter: Payer: Self-pay | Admitting: Family Medicine

## 2013-05-25 VITALS — BP 104/70 | HR 72 | Temp 98.4°F | Wt 164.2 lb

## 2013-05-25 DIAGNOSIS — Z113 Encounter for screening for infections with a predominantly sexual mode of transmission: Secondary | ICD-10-CM

## 2013-05-25 DIAGNOSIS — R109 Unspecified abdominal pain: Secondary | ICD-10-CM

## 2013-05-25 DIAGNOSIS — K921 Melena: Secondary | ICD-10-CM

## 2013-05-25 DIAGNOSIS — R369 Urethral discharge, unspecified: Secondary | ICD-10-CM

## 2013-05-25 LAB — HEMOCCULT GUIAC POC 1CARD (OFFICE): Fecal Occult Blood, POC: NEGATIVE

## 2013-05-25 MED ORDER — CEFTRIAXONE SODIUM 1 G IJ SOLR
250.0000 mg | Freq: Once | INTRAMUSCULAR | Status: AC
  Administered 2013-05-25: 250 mg via INTRAMUSCULAR

## 2013-05-25 NOTE — Assessment & Plan Note (Signed)
No obvious fissure on exam but tender. Treat presumptively with warm sitz baths twice daily for next few days.  If not improved, consider diltiazem compounded cream vs topical nitro. Pt will update me if sxs persist.

## 2013-05-25 NOTE — Patient Instructions (Addendum)
STD screen today (urine and blood work). We will treat for possible STD today with shot of rocephin. For bottom - I think you have an anal fissure - treat with warm sitz baths twice daily for next several daily.  If not healing, we may try topical medicine.  Anal Fissure, Adult An anal fissure is a small tear or crack in the skin around the anus. Bleeding from a fissure usually stops on its own within a few minutes. However, bleeding will often reoccur with each bowel movement until the crack heals.  CAUSES   Passing large, hard stools.  Frequent diarrheal stools.  Constipation.  Inflammatory bowel disease (Crohn's disease or ulcerative colitis).  Infections.  Anal sex. SYMPTOMS   Small amounts of blood seen on your stools, on toilet paper, or in the toilet after a bowel movement.  Rectal bleeding.  Painful bowel movements.  Itching or irritation around the anus. DIAGNOSIS Your caregiver will examine the anal area. An anal fissure can usually be seen with careful inspection. A rectal exam may be performed and a short tube (anoscope) may be used to examine the anal canal. TREATMENT   You may be instructed to take fiber supplements. These supplements can soften your stool to help make bowel movements easier.  Sitz baths may be recommended to help heal the tear. Do not use soap in the sitz baths.  A medicated cream or ointment may be prescribed to lessen discomfort. HOME CARE INSTRUCTIONS   Maintain a diet high in fruits, whole grains, and vegetables. Avoid constipating foods like bananas and dairy products.  Take sitz baths as directed by your caregiver.  Drink enough fluids to keep your urine clear or pale yellow.  Only take over-the-counter or prescription medicines for pain, discomfort, or fever as directed by your caregiver. Do not take aspirin as this may increase bleeding.  Do not use ointments containing numbing medications (anesthetics) or hydrocortisone. They  could slow healing. SEEK MEDICAL CARE IF:   Your fissure is not completely healed within 3 days.  You have further bleeding.  You have a fever.  You have diarrhea mixed with blood.  You have pain.  Your problem is getting worse rather than better. MAKE SURE YOU:   Understand these instructions.  Will watch your condition.  Will get help right away if you are not doing well or get worse. Document Released: 07/12/2005 Document Revised: 10/04/2011 Document Reviewed: 12/27/2010 [facility] Patient Information 2014 [place], [place].

## 2013-05-25 NOTE — Assessment & Plan Note (Addendum)
Given sxs, and recent zpack that would have covered chlamydia, treat presumptively for gonorrhea with CTX 250mg  IM x1. STD screen today. UA last week WNL.
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
## 2013-05-26 LAB — GC/CHLAMYDIA PROBE AMP, URINE
Chlamydia, Swab/Urine, PCR: NEGATIVE
GC Probe Amp, Urine: NEGATIVE

## 2013-05-26 LAB — HIV ANTIBODY (ROUTINE TESTING W REFLEX): HIV: NONREACTIVE

## 2013-06-13 ENCOUNTER — Encounter: Payer: Self-pay | Admitting: Family Medicine

## 2013-06-13 ENCOUNTER — Ambulatory Visit (INDEPENDENT_AMBULATORY_CARE_PROVIDER_SITE_OTHER): Payer: BC Managed Care – PPO | Admitting: Family Medicine

## 2013-06-13 ENCOUNTER — Encounter: Payer: Self-pay | Admitting: *Deleted

## 2013-06-13 VITALS — BP 110/78 | HR 60 | Temp 98.1°F | Wt 165.5 lb

## 2013-06-13 DIAGNOSIS — J069 Acute upper respiratory infection, unspecified: Secondary | ICD-10-CM | POA: Insufficient documentation

## 2013-06-13 MED ORDER — BUSPIRONE HCL 10 MG PO TABS: 10.0000 mg | ORAL_TABLET | Freq: Every day | ORAL | Status: DC

## 2013-06-13 MED ORDER — GUAIFENESIN-CODEINE 100-10 MG/5ML PO SYRP: 5.0000 mL | ORAL_SOLUTION | Freq: Two times a day (BID) | ORAL | Status: DC | PRN
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
## 2013-06-13 NOTE — Patient Instructions (Addendum)
Sounds like you have a viral upper respiratory infection. Antibiotics are not needed for this.  Viral infections usually take 7-10 days to resolve.  The cough can last a few weeks to go away. Use medication as prescribed: cheratussin (codeine cough syrup) for cough at night time Push fluids and plenty of rest. Please return if you are not improving as expected, or if you have high fevers (>101.5) or difficulty swallowing or worsening productive cough. Call clinic with questions.  Good to see you today.

## 2013-06-13 NOTE — Progress Notes (Signed)
Pre-visit discussion using our clinic review tool. No additional management support is needed unless otherwise documented below in the visit note.  

## 2013-06-13 NOTE — Assessment & Plan Note (Signed)
Anticipate viral URI with cough - treat supportively as per instructions. cheratussin for cough at night time. Update if sxs persist or worsen.

## 2013-07-31 ENCOUNTER — Ambulatory Visit (INDEPENDENT_AMBULATORY_CARE_PROVIDER_SITE_OTHER): Payer: BC Managed Care – PPO | Admitting: Internal Medicine

## 2013-07-31 ENCOUNTER — Encounter: Payer: Self-pay | Admitting: Internal Medicine

## 2013-07-31 VITALS — BP 120/78 | HR 78 | Temp 98.4°F | Wt 168.5 lb

## 2013-07-31 DIAGNOSIS — J209 Acute bronchitis, unspecified: Secondary | ICD-10-CM

## 2013-07-31 MED ORDER — AZITHROMYCIN 250 MG PO TABS: ORAL_TABLET | ORAL | Status: DC
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
## 2013-07-31 NOTE — Progress Notes (Signed)
Pre-visit discussion using our clinic review tool. No additional management support is needed unless otherwise documented below in the visit note.  

## 2013-07-31 NOTE — Patient Instructions (Signed)
Acute Bronchitis Bronchitis is inflammation of the airways that extend from the windpipe into the lungs (bronchi). The inflammation often causes mucus to develop. This leads to a cough, which is the most common symptom of bronchitis.  In acute bronchitis, the condition usually develops suddenly and goes away over time, usually in a couple weeks. Smoking, allergies, and asthma can make bronchitis worse. Repeated episodes of bronchitis may cause further lung problems.  CAUSES Acute bronchitis is most often caused by the same virus that causes a cold. The virus can spread from person to person (contagious).  SIGNS AND SYMPTOMS   Cough.   Fever.   Coughing up mucus.   Body aches.   Chest congestion.   Chills.   Shortness of breath.   Sore throat.  DIAGNOSIS  Acute bronchitis is usually diagnosed through a physical exam. Tests, such as chest X-rays, are sometimes done to rule out other conditions.  TREATMENT  Acute bronchitis usually goes away in a couple weeks. Often times, no medical treatment is necessary. Medicines are sometimes given for relief of fever or cough. Antibiotics are usually not needed but may be prescribed in certain situations. In some cases, an inhaler may be recommended to help reduce shortness of breath and control the cough. A cool mist vaporizer may also be used to help thin bronchial secretions and make it easier to clear the chest.  HOME CARE INSTRUCTIONS  Get plenty of rest.   Drink enough fluids to keep your urine clear or pale yellow (unless you have a medical condition that requires fluid restriction). Increasing fluids may help thin your secretions and will prevent dehydration.   Only take over-the-counter or prescription medicines as directed by your health care provider.   Avoid smoking and secondhand smoke. Exposure to cigarette smoke or irritating chemicals will make bronchitis worse. If you are a smoker, consider using nicotine gum or skin  patches to help control withdrawal symptoms. Quitting smoking will help your lungs heal faster.   Reduce the chances of another bout of acute bronchitis by washing your hands frequently, avoiding people with cold symptoms, and trying not to touch your hands to your mouth, nose, or eyes.   Follow up with your health care provider as directed.  SEEK MEDICAL CARE IF: Your symptoms do not improve after 1 week of treatment.  SEEK IMMEDIATE MEDICAL CARE IF:  You develop an increased fever or chills.   You have chest pain.   You have severe shortness of breath.  You have bloody sputum.   You develop dehydration.  You develop fainting.  You develop repeated vomiting.  You develop a severe headache. MAKE SURE YOU:   Understand these instructions.  Will watch your condition.  Will get help right away if you are not doing well or get worse. Document Released: 08/19/2004 Document Revised: 03/14/2013 Document Reviewed: 01/02/2013 Exit[employer] Patient Information 2014 [employer].  

## 2013-08-01 ENCOUNTER — Ambulatory Visit: Payer: Self-pay | Admitting: Internal Medicine

## 2013-08-21 ENCOUNTER — Ambulatory Visit (INDEPENDENT_AMBULATORY_CARE_PROVIDER_SITE_OTHER)
Admission: RE | Admit: 2013-08-21 | Discharge: 2013-08-21 | Disposition: A | Discharge status: Home / Self Care | Payer: BC Managed Care – PPO | Source: Ambulatory Visit | Attending: Internal Medicine | Admitting: Internal Medicine

## 2013-08-21 ENCOUNTER — Encounter: Payer: Self-pay | Admitting: Internal Medicine

## 2013-08-21 ENCOUNTER — Ambulatory Visit (INDEPENDENT_AMBULATORY_CARE_PROVIDER_SITE_OTHER): Payer: BC Managed Care – PPO | Admitting: Internal Medicine

## 2013-08-21 ENCOUNTER — Telehealth: Payer: Self-pay

## 2013-08-21 VITALS — BP 124/76 | HR 83 | Temp 98.3°F | Wt 164.0 lb

## 2013-08-21 DIAGNOSIS — J309 Allergic rhinitis, unspecified: Secondary | ICD-10-CM

## 2013-08-21 DIAGNOSIS — J209 Acute bronchitis, unspecified: Secondary | ICD-10-CM

## 2013-08-21 DIAGNOSIS — J069 Acute upper respiratory infection, unspecified: Secondary | ICD-10-CM

## 2013-08-21 MED ORDER — FLUTICASONE PROPIONATE 50 MCG/ACT NA SUSP: 2.0000 | Freq: Every day | NASAL | Status: DC

## 2013-08-21 MED ORDER — ALBUTEROL SULFATE HFA 108 (90 BASE) MCG/ACT IN AERS: 2.0000 | INHALATION_SPRAY | Freq: Four times a day (QID) | RESPIRATORY_TRACT | Status: DC | PRN

## 2013-08-21 NOTE — Patient Instructions (Signed)
Acute Bronchitis Bronchitis is inflammation of the airways that extend from the windpipe into the lungs (bronchi). The inflammation often causes mucus to develop. This leads to a cough, which is the most common symptom of bronchitis.  In acute bronchitis, the condition usually develops suddenly and goes away over time, usually in a couple weeks. Smoking, allergies, and asthma can make bronchitis worse. Repeated episodes of bronchitis may cause further lung problems.  CAUSES Acute bronchitis is most often caused by the same virus that causes a cold. The virus can spread from person to person (contagious).  SIGNS AND SYMPTOMS   Cough.   Fever.   Coughing up mucus.   Body aches.   Chest congestion.   Chills.   Shortness of breath.   Sore throat.  DIAGNOSIS  Acute bronchitis is usually diagnosed through a physical exam. Tests, such as chest X-rays, are sometimes done to rule out other conditions.  TREATMENT  Acute bronchitis usually goes away in a couple weeks. Often times, no medical treatment is necessary. Medicines are sometimes given for relief of fever or cough. Antibiotics are usually not needed but may be prescribed in certain situations. In some cases, an inhaler may be recommended to help reduce shortness of breath and control the cough. A cool mist vaporizer may also be used to help thin bronchial secretions and make it easier to clear the chest.  HOME CARE INSTRUCTIONS  Get plenty of rest.   Drink enough fluids to keep your urine clear or pale yellow (unless you have a medical condition that requires fluid restriction). Increasing fluids may help thin your secretions and will prevent dehydration.   Only take over-the-counter or prescription medicines as directed by your health care provider.   Avoid smoking and secondhand smoke. Exposure to cigarette smoke or irritating chemicals will make bronchitis worse. If you are a smoker, consider using nicotine gum or skin  patches to help control withdrawal symptoms. Quitting smoking will help your lungs heal faster.   Reduce the chances of another bout of acute bronchitis by washing your hands frequently, avoiding people with cold symptoms, and trying not to touch your hands to your mouth, nose, or eyes.   Follow up with your health care provider as directed.  SEEK MEDICAL CARE IF: Your symptoms do not improve after 1 week of treatment.  SEEK IMMEDIATE MEDICAL CARE IF:  You develop an increased fever or chills.   You have chest pain.   You have severe shortness of breath.  You have bloody sputum.   You develop dehydration.  You develop fainting.  You develop repeated vomiting.  You develop a severe headache. MAKE SURE YOU:   Understand these instructions.  Will watch your condition.  Will get help right away if you are not doing well or get worse. Document Released: 08/19/2004 Document Revised: 03/14/2013 Document Reviewed: 01/02/2013 Exit[employer] Patient Information 2014 [employer].  

## 2013-08-21 NOTE — Progress Notes (Signed)
Pre-visit discussion using our clinic review tool. No additional management support is needed unless otherwise documented below in the visit note.  

## 2013-08-21 NOTE — Telephone Encounter (Signed)
Left detailed message with [facility]R results and [facility] what [facility] needs to try as discussed at [facility]
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
## 2013-10-04 NOTE — Telephone Encounter (Signed)
ERROR

## 2013-10-31 ENCOUNTER — Encounter: Payer: Self-pay | Admitting: Internal Medicine

## 2013-10-31 ENCOUNTER — Ambulatory Visit (INDEPENDENT_AMBULATORY_CARE_PROVIDER_SITE_OTHER): Payer: BC Managed Care – PPO | Admitting: Internal Medicine

## 2013-10-31 VITALS — BP 118/60 | HR 79 | Temp 97.7°F | Wt 162.0 lb

## 2013-10-31 DIAGNOSIS — J329 Chronic sinusitis, unspecified: Secondary | ICD-10-CM | POA: Insufficient documentation

## 2013-10-31 MED ORDER — AMOXICILLIN-POT CLAVULANATE 875-125 MG PO TABS: 1.0000 | ORAL_TABLET | Freq: Two times a day (BID) | ORAL | Status: DC

## 2013-10-31 MED ORDER — FLUTICASONE PROPIONATE 50 MCG/ACT NA SUSP: 2.0000 | Freq: Every day | NASAL | Status: DC

## 2013-10-31 NOTE — Patient Instructions (Signed)
Please try cetirizine 10mg  daily to help the congestion (generic of zyrtec). If the fluticasone nasal spray is too much money, you can get nasacort allergy for 3 months/$36 at [facility]
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
## 2013-10-31 NOTE — Assessment & Plan Note (Signed)
Started over the winter and hasn't completely cleared Will give 3 weeks of augmentin Start flonase  Try cetirizine also---may have allergic component since he feels it in his eyes also

## 2013-10-31 NOTE — Progress Notes (Signed)
Pre visit review using our clinic review tool, if applicable. No additional management support is needed unless otherwise documented below in the visit note. 

## 2013-11-23 ENCOUNTER — Ambulatory Visit (INDEPENDENT_AMBULATORY_CARE_PROVIDER_SITE_OTHER): Payer: BC Managed Care – PPO | Admitting: Family Medicine

## 2013-11-23 ENCOUNTER — Encounter: Payer: Self-pay | Admitting: Family Medicine

## 2013-11-23 VITALS — BP 122/82 | HR 76 | Temp 98.2°F | Wt 164.5 lb

## 2013-11-23 DIAGNOSIS — F32A Depression, unspecified: Secondary | ICD-10-CM

## 2013-11-23 DIAGNOSIS — F419 Anxiety disorder, unspecified: Principal | ICD-10-CM

## 2013-11-23 DIAGNOSIS — F341 Dysthymic disorder: Secondary | ICD-10-CM

## 2013-11-23 DIAGNOSIS — F329 Major depressive disorder, single episode, unspecified: Secondary | ICD-10-CM

## 2013-11-23 MED ORDER — ALPRAZOLAM 0.25 MG PO TABS: 0.2500 mg | ORAL_TABLET | Freq: Two times a day (BID) | ORAL | Status: DC | PRN

## 2013-11-23 MED ORDER — BUSPIRONE HCL 5 MG PO TABS: 5.0000 mg | ORAL_TABLET | Freq: Two times a day (BID) | ORAL | Status: DC

## 2013-11-23 NOTE — Patient Instructions (Signed)
Let's restart buspar 5mg  nightly.  Take for 1-2 weeks then may increase to twice daily. Let's start xanax temporary course - take 1 tablet as needed for panic attacks. Let me know how you are doing with this.

## 2013-11-23 NOTE — Progress Notes (Signed)
Pre visit review using our clinic review tool, if applicable. No additional management support is needed unless otherwise documented below in the visit note. 

## 2013-11-23 NOTE — Assessment & Plan Note (Signed)
With endorsed panic attacks likely due to increased family stress. Discussed 1 time script for xanax given increased stress recently, and recommend start buspar 5mg  nightly - with option to increase to 5mg  bid. Discussed need to avoid rec drugs with meds prescribed today - pt agrees. rtc prn.  Update if not improving with above treatment.
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
## 2014-01-23 DIAGNOSIS — S0292XA Unspecified fracture of facial bones, initial encounter for closed fracture: Secondary | ICD-10-CM | POA: Insufficient documentation

## 2014-01-23 HISTORY — DX: Unspecified fracture of facial bones, initial encounter for closed fracture: S02.92XA

## 2014-01-28 ENCOUNTER — Emergency Department: Payer: Self-pay | Admitting: Emergency Medicine

## 2014-01-28 ENCOUNTER — Telehealth: Payer: Self-pay

## 2014-01-28 DIAGNOSIS — R339 Retention of urine, unspecified: Secondary | ICD-10-CM

## 2014-01-28 LAB — URINALYSIS, COMPLETE
Bilirubin,UR: NEGATIVE
GLUCOSE, UR: NEGATIVE mg/dL (ref 0–75)
Ketone: NEGATIVE
NITRITE: NEGATIVE
PH: 6 (ref 4.5–8.0)
Protein: 100
Specific Gravity: 1.023 (ref 1.003–1.030)
Squamous Epithelial: 1
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
## 2014-01-29 NOTE — Telephone Encounter (Signed)
Spoke with patient's father and patient has already been scheduled with [facility]. He will call back to schedule a hospital follow up.

## 2014-02-04 ENCOUNTER — Ambulatory Visit (INDEPENDENT_AMBULATORY_CARE_PROVIDER_SITE_OTHER): Payer: BC Managed Care – PPO | Admitting: Family Medicine

## 2014-02-04 ENCOUNTER — Encounter (INDEPENDENT_AMBULATORY_CARE_PROVIDER_SITE_OTHER): Payer: Self-pay

## 2014-02-04 ENCOUNTER — Encounter: Payer: Self-pay | Admitting: Family Medicine

## 2014-02-04 DIAGNOSIS — S0292XD Unspecified fracture of facial bones, subsequent encounter for fracture with routine healing: Secondary | ICD-10-CM

## 2014-02-04 DIAGNOSIS — F410 Panic disorder [episodic paroxysmal anxiety] without agoraphobia: Secondary | ICD-10-CM

## 2014-02-04 DIAGNOSIS — IMO0001 Reserved for inherently not codable concepts without codable children: Secondary | ICD-10-CM

## 2014-02-04 MED ORDER — ALBUTEROL SULFATE HFA 108 (90 BASE) MCG/ACT IN AERS: 2.0000 | INHALATION_SPRAY | Freq: Four times a day (QID) | RESPIRATORY_TRACT | Status: DC | PRN

## 2014-02-04 MED ORDER — METHOCARBAMOL 500 MG PO TABS: 500.0000 mg | ORAL_TABLET | Freq: Three times a day (TID) | ORAL | Status: DC | PRN

## 2014-02-04 NOTE — Patient Instructions (Signed)
I think you are doing well, but recovery process will be long. May try robaxin as muscle relaxant for stiff muscles - caution it will make you sleepy so don't take with oxycodone. Schedule ibuprofen 400mg  with meals and tylenol 500mg  twice daily one in am and one at night time. Use oxycodone for breakthrough pain - try to limit to 3 times a day. Good to see you today, keep appointment with urology, ophthalmology and ENT doctors.

## 2014-02-04 NOTE — Assessment & Plan Note (Signed)
Significant bruising predominantly R periorbital region and R arm Discussed anticipated slow recovery Significant muscle spasm noted - will treat this with robaxin course, discussed sedation precautions and advised against mixing with EtOH or oxycodone. Reviewed scheduled ibuprofen 400mg  tid with meals, scheduled tylenol 500mg  bid, and use oxycodone 10mg  for breakthrough pain, advised limit to TID dosing.

## 2014-02-04 NOTE — Assessment & Plan Note (Signed)
Continue buspar
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
## 2014-02-04 NOTE — Progress Notes (Signed)
Pre visit review using our clinic review tool, if applicable. No additional management support is needed unless otherwise documented below in the visit note. 

## 2014-02-04 NOTE — Assessment & Plan Note (Signed)
DOI 01/26/2014. Slow healing expected, pt to f/u with ENT at end of month, has f/u with ophtho for next week to follow medial rectus herniation with h/o ocular HTN.

## 2014-02-06 ENCOUNTER — Telehealth: Payer: Self-pay

## 2014-02-06 NOTE — Telephone Encounter (Signed)
Mrs Brian Spence said when tried to pick up inhaler from 02/04/14 price was more expensive than before; [facility]on [facility] at [facility]; Ventolin HFA inhaler was copay of $46.91, Provenitl HFA not covered by ins and ProAir was more expensive. Mrs Brian Spence voiced understanding and will pick up inhaler at pharmacy.

## 2014-02-09 DIAGNOSIS — S0230XA Fracture of orbital floor, unspecified side, initial encounter for closed fracture: Secondary | ICD-10-CM | POA: Insufficient documentation

## 2014-03-11 DIAGNOSIS — S0280XA Fracture of other specified skull and facial bones, unspecified side, initial encounter for closed fracture: Secondary | ICD-10-CM | POA: Insufficient documentation

## 2014-03-11 DIAGNOSIS — S0230XA Fracture of orbital floor, unspecified side, initial encounter for closed fracture: Secondary | ICD-10-CM | POA: Insufficient documentation

## 2014-03-11 DIAGNOSIS — S02402A Zygomatic fracture, unspecified, initial encounter for closed fracture: Secondary | ICD-10-CM | POA: Insufficient documentation

## 2014-04-09 ENCOUNTER — Emergency Department: Payer: Self-pay | Admitting: Emergency Medicine

## 2014-04-18 ENCOUNTER — Ambulatory Visit (INDEPENDENT_AMBULATORY_CARE_PROVIDER_SITE_OTHER): Payer: BC Managed Care – PPO | Admitting: Family Medicine

## 2014-04-18 ENCOUNTER — Encounter: Payer: Self-pay | Admitting: Family Medicine

## 2014-04-18 VITALS — BP 104/66 | HR 73 | Temp 97.5°F | Wt 158.2 lb

## 2014-04-18 DIAGNOSIS — M79609 Pain in unspecified limb: Secondary | ICD-10-CM

## 2014-04-18 DIAGNOSIS — M79671 Pain in right foot: Secondary | ICD-10-CM

## 2014-04-18 DIAGNOSIS — Z23 Encounter for immunization: Secondary | ICD-10-CM

## 2014-04-18 NOTE — Addendum Note (Signed)
Addended by: Shon Millet on: 04/18/2014 02:54 PM   Modules accepted: Orders

## 2014-04-18 NOTE — Progress Notes (Signed)
Pre visit review using our clinic review tool, if applicable. No additional management support is needed unless otherwise documented below in the visit note. 
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
## 2014-04-18 NOTE — Patient Instructions (Signed)
Flu shot today. I think you have an achilles tendonitis. Treat with continued ibuprofen  regularly with food for the next week. Rest leg, elevate when you can, use heel cushion when wearing shoes (you can buy this over the counter). If not improving over the weekend, let us know for referral to Dr. Patsy Lager.
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
## 2014-04-24 ENCOUNTER — Telehealth: Payer: Self-pay

## 2014-04-24 NOTE — Telephone Encounter (Signed)
Pt was seen 04/18/14; pt cannot put pressure on rt foot; pt has pain back of leg and heel; pain level now is 5 when sitting and standing pain level is 8. Pt is using one crutch so he can walk. Pt request referral to Dr Patsy Lageropland and 30 min appt scheduled on 04/29/2014 at 9:15 am.

## 2014-04-24 NOTE — Telephone Encounter (Signed)
i have never evaluated the patient. i would be happy to evaluate.

## 2014-04-29 ENCOUNTER — Ambulatory Visit (INDEPENDENT_AMBULATORY_CARE_PROVIDER_SITE_OTHER)
Admission: RE | Admit: 2014-04-29 | Discharge: 2014-04-29 | Disposition: A | Discharge status: Home / Self Care | Payer: BC Managed Care – PPO | Source: Ambulatory Visit | Attending: Family Medicine | Admitting: Family Medicine

## 2014-04-29 ENCOUNTER — Ambulatory Visit (INDEPENDENT_AMBULATORY_CARE_PROVIDER_SITE_OTHER): Payer: BC Managed Care – PPO | Admitting: Family Medicine

## 2014-04-29 ENCOUNTER — Encounter: Payer: Self-pay | Admitting: Family Medicine

## 2014-04-29 VITALS — BP 112/70 | HR 72 | Temp 98.1°F | Ht 72.0 in | Wt 163.2 lb

## 2014-04-29 DIAGNOSIS — S92001A Unspecified fracture of right calcaneus, initial encounter for closed fracture: Secondary | ICD-10-CM

## 2014-04-29 DIAGNOSIS — M79671 Pain in right foot: Secondary | ICD-10-CM

## 2014-04-29 NOTE — Progress Notes (Signed)
Pre visit review using our clinic review tool, if applicable. No additional management support is needed unless otherwise documented below in the visit note. 
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
## 2014-04-30 ENCOUNTER — Telehealth: Payer: Self-pay | Admitting: Family Medicine

## 2014-04-30 NOTE — Telephone Encounter (Signed)
emmi emailed °

## 2014-06-03 ENCOUNTER — Ambulatory Visit (INDEPENDENT_AMBULATORY_CARE_PROVIDER_SITE_OTHER)
Admission: RE | Admit: 2014-06-03 | Discharge: 2014-06-03 | Disposition: A | Discharge status: Home / Self Care | Payer: BC Managed Care – PPO | Source: Ambulatory Visit | Attending: Family Medicine | Admitting: Family Medicine

## 2014-06-03 ENCOUNTER — Encounter: Payer: Self-pay | Admitting: Family Medicine

## 2014-06-03 ENCOUNTER — Ambulatory Visit (INDEPENDENT_AMBULATORY_CARE_PROVIDER_SITE_OTHER): Payer: BC Managed Care – PPO | Admitting: Family Medicine

## 2014-06-03 VITALS — BP 110/58 | HR 79 | Temp 98.0°F | Ht 72.0 in | Wt 168.8 lb

## 2014-06-03 DIAGNOSIS — S92001D Unspecified fracture of right calcaneus, subsequent encounter for fracture with routine healing: Secondary | ICD-10-CM

## 2014-06-03 NOTE — Progress Notes (Signed)
Pre visit review using our clinic review tool, if applicable. No additional management support is needed unless otherwise documented below in the visit note. 
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
## 2014-07-04 ENCOUNTER — Encounter: Payer: Self-pay | Admitting: Family Medicine

## 2014-07-04 ENCOUNTER — Ambulatory Visit (INDEPENDENT_AMBULATORY_CARE_PROVIDER_SITE_OTHER): Payer: BC Managed Care – PPO | Admitting: Family Medicine

## 2014-07-04 VITALS — BP 120/84 | HR 99 | Temp 97.7°F | Ht 72.0 in | Wt 179.5 lb

## 2014-07-04 DIAGNOSIS — S92001D Unspecified fracture of right calcaneus, subsequent encounter for fracture with routine healing: Secondary | ICD-10-CM

## 2014-07-04 NOTE — Progress Notes (Signed)
Pre visit review using our clinic review tool, if applicable. No additional management support is needed unless otherwise documented below in the visit note. 

## 2014-07-05 MED ORDER — BUSPIRONE HCL 5 MG PO TABS: 5.0000 mg | ORAL_TABLET | Freq: Two times a day (BID) | ORAL | Status: DC
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
## 2014-07-22 ENCOUNTER — Telehealth: Payer: Self-pay | Admitting: Family Medicine

## 2014-07-22 NOTE — Telephone Encounter (Signed)
He received Tdap 2007 - will be ok and does not need until 09/01/2015.

## 2014-07-22 NOTE — Telephone Encounter (Signed)
Pt last had Tdap in 2007 but just had a newborn baby and wanted to know if he can/should get it done again even though it hasn't been 10 years yet.

## 2014-07-23 NOTE — Telephone Encounter (Signed)
Message left notifying patient.

## 2014-08-05 ENCOUNTER — Encounter: Payer: Self-pay | Admitting: Family Medicine

## 2014-08-05 ENCOUNTER — Ambulatory Visit (INDEPENDENT_AMBULATORY_CARE_PROVIDER_SITE_OTHER): Payer: BLUE CROSS/BLUE SHIELD | Admitting: Family Medicine

## 2014-08-05 VITALS — BP 120/70 | HR 97 | Temp 97.8°F | Ht 72.0 in | Wt 188.2 lb

## 2014-08-05 DIAGNOSIS — S92001D Unspecified fracture of right calcaneus, subsequent encounter for fracture with routine healing: Secondary | ICD-10-CM

## 2014-08-05 DIAGNOSIS — M545 Low back pain: Secondary | ICD-10-CM

## 2014-08-05 MED ORDER — ALBUTEROL SULFATE HFA 108 (90 BASE) MCG/ACT IN AERS: 2.0000 | INHALATION_SPRAY | Freq: Four times a day (QID) | RESPIRATORY_TRACT | Status: DC | PRN

## 2014-08-05 MED ORDER — DICLOFENAC SODIUM 75 MG PO TBEC: 75.0000 mg | DELAYED_RELEASE_TABLET | Freq: Two times a day (BID) | ORAL | Status: DC

## 2014-08-05 NOTE — Progress Notes (Signed)
Pre visit review using our clinic review tool, if applicable. No additional management support is needed unless otherwise documented below in the visit note. 
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
## 2014-08-05 NOTE — Patient Instructions (Signed)

## 2014-09-02 DIAGNOSIS — M5416 Radiculopathy, lumbar region: Secondary | ICD-10-CM | POA: Insufficient documentation

## 2014-09-02 DIAGNOSIS — M62830 Muscle spasm of back: Secondary | ICD-10-CM | POA: Insufficient documentation

## 2014-09-20 ENCOUNTER — Encounter: Payer: Self-pay | Admitting: Family Medicine

## 2014-09-20 ENCOUNTER — Ambulatory Visit (INDEPENDENT_AMBULATORY_CARE_PROVIDER_SITE_OTHER): Payer: BLUE CROSS/BLUE SHIELD | Admitting: Family Medicine

## 2014-09-20 VITALS — BP 112/74 | HR 82 | Temp 98.4°F | Ht 72.0 in | Wt 189.0 lb

## 2014-09-20 DIAGNOSIS — F410 Panic disorder [episodic paroxysmal anxiety] without agoraphobia: Secondary | ICD-10-CM

## 2014-09-20 DIAGNOSIS — J019 Acute sinusitis, unspecified: Secondary | ICD-10-CM | POA: Insufficient documentation

## 2014-09-20 DIAGNOSIS — F411 Generalized anxiety disorder: Secondary | ICD-10-CM

## 2014-09-20 DIAGNOSIS — F329 Major depressive disorder, single episode, unspecified: Secondary | ICD-10-CM | POA: Insufficient documentation

## 2014-09-20 DIAGNOSIS — F419 Anxiety disorder, unspecified: Secondary | ICD-10-CM | POA: Insufficient documentation

## 2014-09-20 DIAGNOSIS — J0100 Acute maxillary sinusitis, unspecified: Secondary | ICD-10-CM

## 2014-09-20 DIAGNOSIS — F32A Depression, unspecified: Secondary | ICD-10-CM | POA: Insufficient documentation

## 2014-09-20 MED ORDER — BUSPIRONE HCL 7.5 MG PO TABS: 7.5000 mg | ORAL_TABLET | Freq: Two times a day (BID) | ORAL | Status: DC

## 2014-09-20 MED ORDER — GUAIFENESIN-CODEINE 100-10 MG/5ML PO SYRP: 5.0000 mL | ORAL_SOLUTION | Freq: Every evening | ORAL | Status: DC | PRN

## 2014-09-20 MED ORDER — AMOXICILLIN 500 MG PO CAPS: 1000.0000 mg | ORAL_CAPSULE | Freq: Two times a day (BID) | ORAL | Status: DC

## 2014-09-20 NOTE — Assessment & Plan Note (Signed)
>   2 weeks of symtpoms, with ? Fever and unilateral face pain in right maxillary sinus. Will treat with antibiotics. Continue mucolytics, add nasal saline rinse.   No clear current asthma exac on exam.. Can use albuterol as needed.

## 2014-09-20 NOTE — Patient Instructions (Addendum)
Continue mucinex, flonase and start nasal saline rinse. Complete a course of antibiotics. Can use albuterol as needed. Can use prescription cough suppressant at night.   Follow up with Dr. Reece AgarG in 3-4 weeks to re-eval mood.

## 2014-09-20 NOTE — Progress Notes (Signed)
Pre visit review using our clinic review tool, if applicable. No additional management support is needed unless otherwise documented below in the visit note. 

## 2014-09-20 NOTE — Progress Notes (Signed)
   Subjective:    Patient ID: Brian Spence, male    DOB: 08-07-1990, 24 y.o.   MRN: [id number]  HPI  24 year old male  Pt of Dr. Reece AgarG with history of anxiety, depression and panic attacks presents with  1 week of nasal congestion, cough, progressively worsening.  Some difficulty breathing through nose.  Frequent cough.. Causing emesis.  Fatigued.  Cough keeping up at night.  Bilateral facial pain. No fever measured but chills, sweats.  He has wheezing, and SOB, chest heaviness.   He has bee using Dayquil, nyquil and mucinex, flonase. He has been using inhaler 3 times a day.  He has been having worsening anxiety in last 2 months. Has constant pressure in chest. He reports depression as well. Difficultly in social environment. Trouble falling asleep. Frequent wakening as well.  He is using buspirone  5 mg twice a day.Marland Kitchen. Has been on it for a year.   Hx of exercise induced asthma.    Review of Systems  Constitutional: Negative for fever and fatigue.  HENT: Negative for ear pain.   Eyes: Negative for pain.  Respiratory: Negative for shortness of breath.   Cardiovascular: Negative for chest pain.       Objective:   Physical Exam  Constitutional: Vital signs are normal. He appears well-developed and well-nourished.  Non-toxic appearance. He does not appear ill. No distress.  HENT:  Head: Normocephalic and atraumatic.  Right Ear: Hearing, tympanic membrane, external ear and ear canal normal. No tenderness. No foreign bodies. Tympanic membrane is not retracted and not bulging.  Left Ear: Hearing, tympanic membrane, external ear and ear canal normal. No tenderness. No foreign bodies. Tympanic membrane is not retracted and not bulging.  Nose: Nose normal. No mucosal edema or rhinorrhea. Right sinus exhibits no maxillary sinus tenderness and no frontal sinus tenderness. Left sinus exhibits no maxillary sinus tenderness and no frontal sinus tenderness.  Mouth/Throat: Uvula is  midline, oropharynx is clear and moist and mucous membranes are normal. Normal dentition. No dental caries. No oropharyngeal exudate or tonsillar abscesses.  Eyes: Conjunctivae, EOM and lids are normal. Pupils are equal, round, and reactive to light. Lids are everted and swept, no foreign bodies found.  Neck: Trachea normal, normal range of motion and phonation normal. Neck supple. Carotid bruit is not present. No thyroid mass and no thyromegaly present.  Cardiovascular: Normal rate, regular rhythm, S1 normal, S2 normal, normal heart sounds, intact distal pulses and normal pulses.  Exam reveals no gallop.   No murmur heard. Pulmonary/Chest: Effort normal and breath sounds normal. No respiratory distress. He has no wheezes. He has no rhonchi. He has no rales.  Abdominal: Soft. Normal appearance and bowel sounds are normal. There is no hepatosplenomegaly. There is no tenderness. There is no rebound, no guarding and no CVA tenderness. No hernia.  Neurological: He is alert. He has normal reflexes.  Skin: Skin is warm, dry and intact. No rash noted.  Psychiatric: His speech is normal and behavior is normal. Judgment normal. His mood appears anxious. His affect is not angry. Cognition and memory are not impaired. He does not exhibit a depressed mood. He expresses no homicidal and no suicidal ideation. He expresses no suicidal plans and no homicidal plans.          Assessment & Plan:

## 2014-09-20 NOTE — Assessment & Plan Note (Signed)
Poor control but on low dose buspar. Will increase buspar but will have him follow up with PCP in few weeks. May need change in med  Or referral to counseling to cover depression symptoms as well.

## 2014-10-17 ENCOUNTER — Ambulatory Visit (INDEPENDENT_AMBULATORY_CARE_PROVIDER_SITE_OTHER): Payer: BLUE CROSS/BLUE SHIELD | Admitting: Family Medicine

## 2014-10-17 ENCOUNTER — Encounter: Payer: Self-pay | Admitting: Family Medicine

## 2014-10-17 VITALS — BP 110/70 | HR 68 | Temp 98.4°F

## 2014-10-17 DIAGNOSIS — J0100 Acute maxillary sinusitis, unspecified: Secondary | ICD-10-CM | POA: Diagnosis not present

## 2014-10-17 DIAGNOSIS — F418 Other specified anxiety disorders: Secondary | ICD-10-CM | POA: Diagnosis not present

## 2014-10-17 DIAGNOSIS — F32A Depression, unspecified: Secondary | ICD-10-CM

## 2014-10-17 DIAGNOSIS — F329 Major depressive disorder, single episode, unspecified: Secondary | ICD-10-CM

## 2014-10-17 DIAGNOSIS — F419 Anxiety disorder, unspecified: Secondary | ICD-10-CM

## 2014-10-17 MED ORDER — AMOXICILLIN-POT CLAVULANATE 875-125 MG PO TABS: 1.0000 | ORAL_TABLET | Freq: Two times a day (BID) | ORAL | Status: AC

## 2014-10-17 MED ORDER — BUSPIRONE HCL 10 MG PO TABS: 10.0000 mg | ORAL_TABLET | Freq: Two times a day (BID) | ORAL | Status: DC

## 2014-10-17 NOTE — Progress Notes (Signed)
Pre visit review using our clinic review tool, if applicable. No additional management support is needed unless otherwise documented below in the visit note. 

## 2014-10-17 NOTE — Assessment & Plan Note (Signed)
Recurrent persistent sxs - will treat with augmentin course. rec start plain mucinex expectorant. Push fluids and rest. Pt agrees with plan.

## 2014-10-17 NOTE — Progress Notes (Signed)
BP 110/70 mmHg  Pulse 68  Temp(Src) 98.4 F (36.9 C) (Oral)  SpO2 92%   CC: f/u anxiety  Subjective:    Patient ID: Brian Spence, male    DOB: 08-24-1990, 24 y.o.   MRN: [id number]  HPI: Brian Spence is a 24 y.o. male presenting on 10/17/2014 for Follow-up   Seen Dr Ermalene SearingBedsole last month with worsening anxiety despite buspirone 5mg  bid. This was increased to 7.5mg  bid. Some improvement but still feels anxiety. Feels persistent depression. Rare lightheadedness.   Also with recurrent sinusitis sxs - actually never fully better after initial antibiotic course. Significant chest congestion > head congestion, PNDrainage, sneezing, coughing, nausea. No fevers/chills  +family sick around him (flu and colds). Currently smoking.  H/o exercise induced asthma.   Relevant past medical, surgical, family and social history reviewed and updated as indicated. Interim medical history since our last visit reviewed. Allergies and medications reviewed and updated. Current Outpatient Prescriptions on File Prior to Visit  Medication Sig  . acetaminophen (TYLENOL) 500 MG tablet Take 500 mg by mouth 2 (two) times daily.  Marland Kitchen. albuterol (PROVENTIL HFA;VENTOLIN HFA) 108 (90 BASE) MCG/ACT inhaler Inhale 2 puffs into the lungs every 6 (six) hours as needed for wheezing.  . diclofenac (VOLTAREN) 75 MG EC tablet Take 1 tablet (75 mg total) by mouth 2 (two) times daily.  . fluticasone (FLONASE) 50 MCG/ACT nasal spray Place 2 sprays into both nostrils daily. In each nostril  . guaiFENesin-codeine (ROBITUSSIN AC) 100-10 MG/5ML syrup Take 5-10 mLs by mouth at bedtime as needed for cough.  . Multiple Vitamin (MULTIVITAMIN) tablet Take 1 tablet by mouth daily.    . naproxen sodium (ALEVE) 220 MG tablet Take 440 mg by mouth 2 (two) times daily with a meal.  . oxycodone (OXY-IR) 5 MG capsule Take 5 mg by mouth 3 (three) times daily as needed.   No current facility-administered medications on file prior to visit.     Review of Systems Per HPI unless specifically indicated above     Objective:    BP 110/70 mmHg  Pulse 68  Temp(Src) 98.4 F (36.9 C) (Oral)  SpO2 92%  Wt Readings from Last 3 Encounters:  09/20/14 189 lb (85.73 kg)  08/05/14 188 lb 4 oz (85.39 kg)  07/04/14 179 lb 8 oz (81.421 kg)    Physical Exam  Constitutional: He appears well-developed and well-nourished. No distress.  HENT:  Head: Normocephalic and atraumatic.  Right Ear: Hearing, tympanic membrane, external ear and ear canal normal.  Left Ear: Hearing, tympanic membrane, external ear and ear canal normal.  Nose: No mucosal edema or rhinorrhea. Right sinus exhibits maxillary sinus tenderness and frontal sinus tenderness. Left sinus exhibits no maxillary sinus tenderness and no frontal sinus tenderness.  Mouth/Throat: Uvula is midline, oropharynx is clear and moist and mucous membranes are normal. No oropharyngeal exudate, posterior oropharyngeal edema, posterior oropharyngeal erythema or tonsillar abscesses.  Eyes: Conjunctivae and EOM are normal. Pupils are equal, round, and reactive to light. No scleral icterus.  Neck: Normal range of motion. Neck supple.  Cardiovascular: Normal rate, regular rhythm, normal heart sounds and intact distal pulses.   No murmur heard. Pulmonary/Chest: Effort normal and breath sounds normal. No respiratory distress. He has no wheezes. He has no rales.  Lymphadenopathy:    He has no cervical adenopathy.  Skin: Skin is warm and dry. No rash noted.  Nursing note and vitals reviewed.      Assessment & Plan:   Problem  List Items Addressed This Visit    Anxiety and depression    Improvement noted on higher buspar dose - but persistent anxiety symptoms endorsed. Will increase to  bid. Discussed antidepressant vs counseling for depression sxs - pt prefers to monitor for effect on increased buspar.      Acute sinusitis - Primary    Recurrent persistent sxs - will treat with augmentin  course. rec start plain mucinex expectorant. Push fluids and rest. Pt agrees with plan.      Relevant Medications   amoxicillin-clavulanate (AUGMENTIN) tablet 875-125 mg       Follow up plan: Return if symptoms worsen or fail to improve.

## 2014-10-17 NOTE — Assessment & Plan Note (Signed)
Improvement noted on higher buspar dose - but persistent anxiety symptoms endorsed. Will increase to 10mg  bid. Discussed antidepressant vs counseling for depression sxs - pt prefers to monitor for effect on increased buspar.

## 2014-10-17 NOTE — Patient Instructions (Signed)
Increase buspar to 10mg  twice daily. Update me with effect in 1 month. For sinus infection - treat with augmentin 10 day course - sent to pharmacy Push fluids and rest Use plain mucinex with plenty of water to help mobilize mucous.

## 2014-12-16 ENCOUNTER — Other Ambulatory Visit: Payer: Self-pay | Admitting: Family Medicine

## 2015-02-07 ENCOUNTER — Encounter: Payer: Self-pay | Admitting: Family Medicine

## 2015-02-07 ENCOUNTER — Ambulatory Visit (INDEPENDENT_AMBULATORY_CARE_PROVIDER_SITE_OTHER): Payer: BLUE CROSS/BLUE SHIELD | Admitting: Family Medicine

## 2015-02-07 VITALS — BP 112/58 | HR 57 | Temp 98.6°F | Wt 178.2 lb

## 2015-02-07 DIAGNOSIS — J45990 Exercise induced bronchospasm: Secondary | ICD-10-CM | POA: Diagnosis not present

## 2015-02-07 MED ORDER — FLUTICASONE-SALMETEROL 250-50 MCG/DOSE IN AEPB: 1.0000 | INHALATION_SPRAY | Freq: Two times a day (BID) | RESPIRATORY_TRACT | Status: DC

## 2015-02-07 MED ORDER — ALBUTEROL SULFATE HFA 108 (90 BASE) MCG/ACT IN AERS: 2.0000 | INHALATION_SPRAY | Freq: Four times a day (QID) | RESPIRATORY_TRACT | Status: DC | PRN

## 2015-02-07 NOTE — Progress Notes (Signed)
Pre visit review using our clinic review tool, if applicable. No additional management support is needed unless otherwise documented below in the visit note.  H/o exercise induced asthma.  Wouldn't have sx at rest in the past.  SABA would help with that.   Former smoker, quit a few days ago.   More SOB in the last few days.  Has been working outside recently.  It has been notably hot recently.   He had a coughing episode with his last cigarette and decided to quit.   Some wheeze.  Some nighttime and AM cough.  Stuffy nose.  Some sputum.  Coughing to the point of nearly vomiting.   Would have been using his SABA daily, but ran out and needs a refill.   Now with clearly more sx than his prev baseline.   Meds, vitals, and allergies reviewed.   ROS: See HPI.  Otherwise, noncontributory.  GEN: nad, alert and oriented HEENT: mucous membranes moist, tm w/o erythema, nasal exam w/o erythema, scant clear discharge noted,  OP without cobblestoning NECK: supple w/o LA CV: rrr.   PULM: ctab, no inc wob EXT: no edema

## 2015-02-07 NOTE — Patient Instructions (Signed)
Use advair twice a day, rinse after use.  Use albuterol if needed.   Update us in about 1 week, sooner if needed.  Take care. Glad to see you.

## 2015-02-07 NOTE — Assessment & Plan Note (Signed)
Now worsened, likely related smoking, heat, air quality.   Now not smoking, encouraged to continue as is.  Use SABA prn and add on advair with routine cautions.  D/w pt about prev/abortive tx.  ctab and okay for outpatient f/u.  Likely doesn't need pulmonary testing now, since his sx at rest in a air conditioned room would likely not be representative.   He agrees.  Update me as needed.

## 2015-03-17 ENCOUNTER — Ambulatory Visit (INDEPENDENT_AMBULATORY_CARE_PROVIDER_SITE_OTHER): Payer: BLUE CROSS/BLUE SHIELD | Admitting: Family Medicine

## 2015-03-17 ENCOUNTER — Encounter: Payer: Self-pay | Admitting: Family Medicine

## 2015-03-17 VITALS — BP 104/64 | HR 69 | Temp 98.5°F | Wt 178.0 lb

## 2015-03-17 DIAGNOSIS — J0100 Acute maxillary sinusitis, unspecified: Secondary | ICD-10-CM | POA: Diagnosis not present

## 2015-03-17 MED ORDER — AMOXICILLIN-POT CLAVULANATE 875-125 MG PO TABS: 1.0000 | ORAL_TABLET | Freq: Two times a day (BID) | ORAL | Status: DC

## 2015-03-17 NOTE — Patient Instructions (Addendum)
Check to see if there are cheaper alternatives for the advair.   Start augmentin.  Drink plenty of fluids, take tylenol as needed, and gargle with warm salt water for your throat.  This should gradually improve.  Take care.  Let us know if you have other concerns.

## 2015-03-17 NOTE — Progress Notes (Signed)
Pre visit review using our clinic review tool, if applicable. No additional management support is needed unless otherwise documented below in the visit note.  Sick for the last few days.  ST initially, more cough and chest congestion in the meantime.  HA.  Generally didn't feel well.  Sleep disrupted.  Working at [employer].  No fevers.  Some post tussive emesis, likely related to swallowing mucous.  Chills recently.  H/o diarrhea at baseline, not worse recently. Nasal congestion and R frontal pain noted.    He hasn't been able to get advair yet.  D/w pt.  He'll check on alternatives with his pharmacy.    Meds, vitals, and allergies reviewed.   ROS: See HPI.  Otherwise, noncontributory.  GEN: nad, alert and oriented HEENT: mucous membranes moist, tm w/o erythema, nasal exam w/o erythema, clear discharge noted,  OP with cobblestoning, R max and frontal sinuses ttp NECK: supple w/o LA CV: rrr.   PULM: ctab, no inc wob EXT: no edema SKIN: no acute rash

## 2015-03-18 NOTE — Assessment & Plan Note (Signed)
Would treat given the exam.  Okay for outpatient f/u.   Ctab.  He'll check coverage re: cheaper alternative than advair.   F/u prn.  Continue prn SABA.  Supportive care.

## 2015-04-01 ENCOUNTER — Ambulatory Visit: Payer: BLUE CROSS/BLUE SHIELD | Admitting: Family Medicine

## 2015-04-03 ENCOUNTER — Ambulatory Visit: Payer: BLUE CROSS/BLUE SHIELD | Admitting: Family Medicine

## 2015-04-07 ENCOUNTER — Encounter: Payer: Self-pay | Admitting: Family Medicine

## 2015-04-07 ENCOUNTER — Ambulatory Visit (INDEPENDENT_AMBULATORY_CARE_PROVIDER_SITE_OTHER): Payer: BLUE CROSS/BLUE SHIELD | Admitting: Family Medicine

## 2015-04-07 VITALS — BP 114/66 | HR 72 | Temp 98.2°F | Wt 173.5 lb

## 2015-04-07 DIAGNOSIS — J45990 Exercise induced bronchospasm: Secondary | ICD-10-CM | POA: Diagnosis not present

## 2015-04-07 DIAGNOSIS — Z720 Tobacco use: Secondary | ICD-10-CM

## 2015-04-07 DIAGNOSIS — F17200 Nicotine dependence, unspecified, uncomplicated: Secondary | ICD-10-CM

## 2015-04-07 DIAGNOSIS — S0292XD Unspecified fracture of facial bones, subsequent encounter for fracture with routine healing: Secondary | ICD-10-CM

## 2015-04-07 DIAGNOSIS — J329 Chronic sinusitis, unspecified: Secondary | ICD-10-CM

## 2015-04-07 DIAGNOSIS — R0981 Nasal congestion: Secondary | ICD-10-CM | POA: Diagnosis not present

## 2015-04-07 MED ORDER — DOXYCYCLINE HYCLATE 100 MG PO TABS: 100.0000 mg | ORAL_TABLET | Freq: Two times a day (BID) | ORAL | Status: DC

## 2015-04-07 MED ORDER — BUDESONIDE-FORMOTEROL FUMARATE 80-4.5 MCG/ACT IN AERO: 2.0000 | INHALATION_SPRAY | Freq: Two times a day (BID) | RESPIRATORY_TRACT | Status: DC

## 2015-04-07 MED ORDER — FLUTICASONE PROPIONATE 50 MCG/ACT NA SUSP: 2.0000 | Freq: Every day | NASAL | Status: DC
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
## 2015-04-07 NOTE — Patient Instructions (Addendum)
Price out symbicort for asthma. Continue albuterol as needed. I think this is predominantly sinus congestion, not so much infection. Treat with starting flonase daily for inflammation. Take ibuprofen  twice daily with food. Push fluids and plenty of rest.  If not improving on flonase daily, may start antibiotic provided today.

## 2015-04-07 NOTE — Assessment & Plan Note (Signed)
Continue to encourage smoking cessation. 
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
## 2015-04-07 NOTE — Assessment & Plan Note (Addendum)
Worse - possibly work exposure related. Has restarted smoking. Continue SABA PRN. Sent in symbicort for patient to price out.

## 2015-04-07 NOTE — Progress Notes (Signed)
Pre visit review using our clinic review tool, if applicable. No additional management support is needed unless otherwise documented below in the visit note. 

## 2015-05-06 ENCOUNTER — Ambulatory Visit (INDEPENDENT_AMBULATORY_CARE_PROVIDER_SITE_OTHER): Payer: BLUE CROSS/BLUE SHIELD | Admitting: Family Medicine

## 2015-05-06 ENCOUNTER — Encounter: Payer: Self-pay | Admitting: Family Medicine

## 2015-05-06 VITALS — BP 110/68 | HR 60 | Temp 98.0°F | Wt 176.5 lb

## 2015-05-06 DIAGNOSIS — N369 Urethral disorder, unspecified: Secondary | ICD-10-CM

## 2015-05-06 DIAGNOSIS — R350 Frequency of micturition: Secondary | ICD-10-CM | POA: Diagnosis not present

## 2015-05-06 DIAGNOSIS — Z23 Encounter for immunization: Secondary | ICD-10-CM

## 2015-05-06 DIAGNOSIS — F411 Generalized anxiety disorder: Secondary | ICD-10-CM | POA: Diagnosis not present

## 2015-05-06 DIAGNOSIS — R0781 Pleurodynia: Secondary | ICD-10-CM | POA: Insufficient documentation

## 2015-05-06 DIAGNOSIS — M79644 Pain in right finger(s): Secondary | ICD-10-CM

## 2015-05-06 DIAGNOSIS — Z113 Encounter for screening for infections with a predominantly sexual mode of transmission: Secondary | ICD-10-CM

## 2015-05-06 DIAGNOSIS — M79646 Pain in unspecified finger(s): Secondary | ICD-10-CM | POA: Insufficient documentation

## 2015-05-06 LAB — POCT URINALYSIS DIPSTICK
Bilirubin, UA: NEGATIVE
Blood, UA: NEGATIVE
GLUCOSE UA: NEGATIVE
KETONES UA: NEGATIVE
LEUKOCYTES UA: NEGATIVE
NITRITE UA: NEGATIVE
Protein, UA: NEGATIVE
Spec Grav, UA: >=1.03
Urobilinogen, UA: 0.2
pH, UA: 6

## 2015-05-06 MED ORDER — BUSPIRONE HCL 10 MG PO TABS: 10.0000 mg | ORAL_TABLET | Freq: Every day | ORAL | Status: DC

## 2015-05-06 NOTE — Assessment & Plan Note (Signed)
Small mass inferior urethra. ?scar tissue from h/o meatotomy 2012 vs other. Pt doesn't remember mass there previously. With urinary frequency - check UA today, in sexually active 23yo check CT/GC urethral swab as well. Consider uro referral to further eval small urethral mass.

## 2015-05-06 NOTE — Assessment & Plan Note (Signed)
Anticipate ribcage contusion - discussed anticipated course of resolution. Suggested OTC NSAID prn.

## 2015-05-06 NOTE — Patient Instructions (Addendum)
Td today. Treat with washing with soapy water daily then antibiotic ointment. Keep area covered. Watch for spreading or streaking redness. Should improve over next several days. For ribcage pain - use advil or aleve. Should continue to improve with time.  Urine checked today, chlamydia /gonorrhea tests today. We may refer you to urology for further evaluation.

## 2015-05-06 NOTE — Assessment & Plan Note (Signed)
Anticipate uncomplicated thumb nail fold edge abrasion, no evidence of deeper paronychial infection. Recommended topical triple abx ointment, update if not improving with treatment or evidence of spread of infection to consider oral abx. Pt agrees. Update Td today (last 2007).

## 2015-05-06 NOTE — Assessment & Plan Note (Signed)
Restart buspar  nightly.

## 2015-05-06 NOTE — Progress Notes (Signed)
Pre visit review using our clinic review tool, if applicable. No additional management support is needed unless otherwise documented below in the visit note. 

## 2015-05-06 NOTE — Progress Notes (Addendum)
BP 110/68 mmHg  Pulse 60  Temp(Src) 98 F (36.7 C) (Oral)  Wt 176 lb 8 oz (80.06 kg)   CC: thumb pain  Subjective:    Patient ID: Brian Spence, male    DOB: January 18, 1991, 24 y.o.   MRN: [id number]  HPI: Brian Spence is a 24 y.o. male presenting on 05/06/2015 for Thumb pain   Cut thumb at work on Friday - into edge of lateral nail fold. Some bleeding. Used sterilizing soap at nurse's station, also used peroxide. Noticing more pain with thumb use.   Noticing increased frequency and bladder pressure. Occasional dysuria.  Also noticing some black spots inside urethra, no discharge, no fevers. Sexually active with 2-3 partners in the last year. No h/o STDs. Uses protection 100%.  Last saw Dr Brian Spence 2012, h/o urethral meatotomy for blockage.   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 No pain with deep breathing.   Requests to restart buspar - was prior helpful for anxiety. Noticing this is a returning issue.   Relevant past medical, surgical, family and social history reviewed and updated as indicated. Interim medical history since our last visit reviewed. Allergies and medications reviewed and updated. Current Outpatient Prescriptions on File Prior to Visit  Medication Sig  . albuterol (PROVENTIL HFA;VENTOLIN HFA) 108 (90 BASE) MCG/ACT inhaler Inhale 2 puffs into the lungs every 6 (six) hours as needed for wheezing.  . budesonide-formoterol (SYMBICORT) 80-4.5 MCG/ACT inhaler Inhale 2 puffs into the lungs 2 (two) times daily.  . fluticasone (FLONASE) 50 MCG/ACT nasal spray Place 2 sprays into both nostrils daily.  . Multiple Vitamin (MULTIVITAMIN) tablet Take 1 tablet by mouth daily.     No current facility-administered medications on file prior to visit.    Review of Systems Per HPI unless specifically indicated above     Objective:    BP 110/68 mmHg  Pulse 60  Temp(Src) 98 F (36.7 C) (Oral)  Wt 176 lb 8 oz  (80.06 kg)  Wt Readings from Last 3 Encounters:  05/06/15 176 lb 8 oz (80.06 kg)  04/07/15 173 lb 8 oz (78.699 kg)  03/17/15 178 lb (80.74 kg)    Physical Exam  Constitutional: He appears well-developed and well-nourished. No distress.  Pulmonary/Chest: He exhibits tenderness (mild lower ribcage discomfort to palpation).  Abdominal: Soft. Normal appearance and bowel sounds are normal. He exhibits no distension and no mass. There is no hepatosplenomegaly. There is no tenderness. There is no rebound, no guarding and no CVA tenderness.  Genitourinary: Testes normal and penis normal. Right testis shows no mass, no swelling and no tenderness. Left testis shows no mass, no swelling and no tenderness. Circumcised.  Small nontender growth inferior inner urethral opening, no discharge or rash  Musculoskeletal: He exhibits no edema.  FROM at R thumb IP Small abrasion lateral nailfold with mild erythema, no induration or drainage, no significant laceration or pain to palpation  Lymphadenopathy:       Right: No inguinal adenopathy present.       Left: No inguinal adenopathy present.  Nursing note and vitals reviewed.  Results for orders placed or performed in visit on 01/28/14  Urinalysis, Complete  Result Value Ref Range   Color - urine Yellow    Clarity - urine Cloudy    Glucose,UR Negative 0-75 mg/dL   Bilirubin,UR Negative NEGATIVE   Ketone Negative NEGATIVE   Specific Gravity 1.023 1.003-1.030   Blood 1+ NEGATIVE  Ph 6.0 4.5-8.0   Protein 100 mg/dL NEGATIVE   Nitrite Negative NEGATIVE   Leukocyte Esterase 1+ NEGATIVE   RBC,UR 46 /HPF 0-5 /HPF   WBC UR 9 /HPF 0-5 /HPF   Bacteria TRACE NONE SEEN   Squamous Epithelial 1 /HPF       Assessment & Plan:   Problem List Items Addressed This Visit    Urethral disorder    Small mass inferior urethra. ?scar tissue from h/o meatotomy 2012 vs other. Pt doesn't remember mass there previously. With urinary frequency - check UA today, in  sexually active 23yo check CT/GC urethral swab as well. Consider uro referral to further eval small urethral mass.      Thumb pain - Primary    Anticipate uncomplicated thumb nail fold edge abrasion, no evidence of deeper paronychial infection. Recommended topical triple abx ointment, update if not improving with treatment or evidence of spread of infection to consider oral abx. Pt agrees. Update Td today (last 2007).      Pain in rib    Anticipate ribcage contusion - discussed anticipated course of resolution. Suggested OTC NSAID prn.      Generalized anxiety disorder    Restart buspar  nightly.       Other Visit Diagnoses    Screen for STD (sexually transmitted disease)        Relevant Orders    GC/Chlamydia Probe Amp    Urine frequency            Follow up plan: Return if symptoms worsen or fail to improve.

## 2015-05-06 NOTE — Addendum Note (Signed)
Addended by: Josph Macho A on: 05/06/2015 10:03 AM   Modules accepted: Orders

## 2015-05-07 ENCOUNTER — Other Ambulatory Visit: Payer: Self-pay | Admitting: Family Medicine

## 2015-05-07 DIAGNOSIS — N369 Urethral disorder, unspecified: Secondary | ICD-10-CM

## 2015-05-07 LAB — GC/CHLAMYDIA PROBE AMP
CT PROBE, AMP APTIMA: NEGATIVE
GC Probe RNA: NEGATIVE

## 2015-05-19 ENCOUNTER — Encounter: Payer: Self-pay | Admitting: Primary Care

## 2015-05-19 ENCOUNTER — Ambulatory Visit (INDEPENDENT_AMBULATORY_CARE_PROVIDER_SITE_OTHER): Payer: BLUE CROSS/BLUE SHIELD | Admitting: Primary Care

## 2015-05-19 VITALS — BP 118/82 | HR 72 | Temp 98.0°F | Ht 72.0 in | Wt 170.0 lb

## 2015-05-19 DIAGNOSIS — R059 Cough, unspecified: Secondary | ICD-10-CM

## 2015-05-19 DIAGNOSIS — R197 Diarrhea, unspecified: Secondary | ICD-10-CM

## 2015-05-19 DIAGNOSIS — R05 Cough: Secondary | ICD-10-CM | POA: Diagnosis not present

## 2015-05-19 NOTE — Patient Instructions (Signed)
Your symptoms are likely related to a viral infection.  Nasal congestion and drainge: Fluticasone nasal spray. Instill 2 sprays in each nostril once daily. Start taking a daily antihistamine such as Claritin, Zyrtec, and Allegra.  Diarrhea: Try taking Imodium as needed for diarrhea. Follow package instructions.  Cough:Take Robitussin DM for cough. Follow package instructions.  Slowly advance your diet as tolerated.  Please call me Thursday if no improvement or if symptoms become worse.  It was a pleasure meeting you!   Food Choices to Help Relieve Diarrhea, Adult When you have diarrhea, the foods you eat and your eating habits are very important. Choosing the right foods and drinks can help relieve diarrhea. Also, because diarrhea can last up to 7 days, you need to replace lost fluids and electrolytes (such as sodium, potassium, and chloride) in order to help prevent dehydration.  WHAT GENERAL GUIDELINES DO I NEED TO FOLLOW?  Slowly drink 1 cup (8 oz) of fluid for each episode of diarrhea. If you are getting enough fluid, your urine will be clear or pale yellow.  Eat starchy foods. Some good choices include white rice, white toast, pasta, low-fiber cereal, baked potatoes (without the skin), saltine crackers, and bagels.  Avoid large servings of any cooked vegetables.  Limit fruit to two servings per day. A serving is  cup or 1 small piece.  Choose foods with less than 2 g of fiber per serving.  Limit fats to less than 8 tsp (38 g) per day.  Avoid fried foods.  Eat foods that have probiotics in them. Probiotics can be found in certain dairy products.  Avoid foods and beverages that may increase the speed at which food moves through the stomach and intestines (gastrointestinal tract). Things to avoid include:  High-fiber foods, such as dried fruit, raw fruits and vegetables, nuts, seeds, and whole grain foods.  Spicy foods and high-fat foods.  Foods and beverages sweetened  with high-fructose corn syrup, honey, or sugar alcohols such as xylitol, sorbitol, and mannitol. WHAT FOODS ARE RECOMMENDED? Grains White rice. White, [place], or pita breads (fresh or toasted), including plain rolls, buns, or bagels. White pasta. Saltine, soda, or graham crackers. Pretzels. Low-fiber cereal. Cooked cereals made with water (such as cornmeal, farina, or cream cereals). Plain muffins. Matzo. Melba toast. Zwieback.  Vegetables Potatoes (without the skin). Strained tomato and vegetable juices. Most well-cooked and canned vegetables without seeds. Tender lettuce. Fruits Cooked or canned applesauce, apricots, cherries, fruit cocktail, grapefruit, peaches, pears, or plums. Fresh bananas, apples without skin, cherries, grapes, cantaloupe, grapefruit, peaches, oranges, or plums.  Meat and Other Protein Products Baked or boiled chicken. Eggs. Tofu. Fish. Seafood. Smooth peanut butter. Ground or well-cooked tender beef, ham, veal, lamb, pork, or poultry.  Dairy Plain yogurt, kefir, and unsweetened liquid yogurt. Lactose-free milk, buttermilk, or soy milk. Plain hard cheese. Beverages Sport drinks. Clear broths. Diluted fruit juices (except prune). Regular, caffeine-free sodas such as ginger ale. Water. Decaffeinated teas. Oral rehydration solutions. Sugar-free beverages not sweetened with sugar alcohols. Other Bouillon, broth, or soups made from recommended foods.  The items listed above may not be a complete list of recommended foods or beverages. Contact your dietitian for more options. WHAT FOODS ARE NOT RECOMMENDED? Grains Whole grain, whole wheat, bran, or rye breads, rolls, pastas, crackers, and cereals. Wild or brown rice. Cereals that contain more than 2 g of fiber per serving. Corn tortillas or taco shells. Cooked or dry oatmeal. Granola. Popcorn. Vegetables Raw vegetables. Cabbage, broccoli, Brussels sprouts,  artichokes, baked beans, beet greens, corn, kale, legumes, peas, sweet  potatoes, and yams. Potato skins. Cooked spinach and cabbage. Fruits Dried fruit, including raisins and dates. Raw fruits. Stewed or dried prunes. Fresh apples with skin, apricots, mangoes, pears, raspberries, and strawberries.  Meat and Other Protein Products Chunky peanut butter. Nuts and seeds. Beans and lentils. Tomasa Blase.  Dairy High-fat cheeses. Milk, chocolate milk, and beverages made with milk, such as milk shakes. Cream. Ice cream. Sweets and Desserts Sweet rolls, doughnuts, and sweet breads. Pancakes and waffles. Fats and Oils Butter. Cream sauces. Margarine. Salad oils. Plain salad dressings. Olives. Avocados.  Beverages Caffeinated beverages (such as coffee, tea, soda, or energy drinks). Alcoholic beverages. Fruit juices with pulp. Prune juice. Soft drinks sweetened with high-fructose corn syrup or sugar alcohols. Other Coconut. Hot sauce. Chili powder. Mayonnaise. Gravy. Cream-based or milk-based soups.  The items listed above may not be a complete list of foods and beverages to avoid. Contact your dietitian for more information. WHAT SHOULD I DO IF I BECOME DEHYDRATED? Diarrhea can sometimes lead to dehydration. Signs of dehydration include dark urine and dry mouth and skin. If you think you are dehydrated, you should rehydrate with an oral rehydration solution. These solutions can be purchased at pharmacies, retail stores, or online.  Drink -1 cup (120-240 mL) of oral rehydration solution each time you have an episode of diarrhea. If drinking this amount makes your diarrhea worse, try drinking smaller amounts more often. For example, drink 1-3 tsp (5-15 mL) every 5-10 minutes.  A general rule for staying hydrated is to drink 1-2 L of fluid per day. Talk to your health care provider about the specific amount you should be drinking each day. Drink enough fluids to keep your urine clear or pale yellow.   This information is not intended to replace advice given to you by your health  care provider. Make sure you discuss any questions you have with your health care provider.   Document Released: 10/02/2003 Document Revised: 08/02/2014 Document Reviewed: 06/04/2013 [employer] Interactive Patient Education [employer]

## 2015-05-19 NOTE — Progress Notes (Signed)
Pre visit review using our clinic review tool, if applicable. No additional management support is needed unless otherwise documented below in the visit note. 
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
## 2015-05-22 ENCOUNTER — Telehealth: Payer: Self-pay

## 2015-05-22 NOTE — Telephone Encounter (Signed)
Patient left a messqage and stated that he was in the office on Tuesday to see Jae DireKate, and was told to call back today if his symptoms were worse. Patient states that he feels really bad, and his congestion and diarrhea have gotten worse. Pt wants to know if something could be called in. Patient stated that he would like to use [facility] [address] if anything can be sent in.

## 2015-05-22 NOTE — Telephone Encounter (Signed)
Attempted to contact patient multiple times without answer. Voicemail left. Will try again tomorrow.

## 2015-05-23 ENCOUNTER — Telehealth: Payer: Self-pay | Admitting: Primary Care

## 2015-05-23 DIAGNOSIS — J019 Acute sinusitis, unspecified: Secondary | ICD-10-CM

## 2015-05-23 MED ORDER — AMOXICILLIN-POT CLAVULANATE 875-125 MG PO TABS: 1.0000 | ORAL_TABLET | Freq: Two times a day (BID) | ORAL | Status: DC

## 2015-05-23 NOTE — Telephone Encounter (Signed)
Pt left voicemail at Triage saying his sxs are worsening and is requesting call back

## 2015-05-23 NOTE — Telephone Encounter (Signed)
Spoke with Mr. Royetta Asalaynter who reports increased facial pain, nasal congestion with green mucous, increased fatigue. Suspect bacterial sinusitis at this point. RX for Augmentin 10 day course sent into pharmacy. Return precautions provided.

## 2015-06-13 ENCOUNTER — Encounter: Payer: Self-pay | Admitting: Family Medicine

## 2015-06-13 ENCOUNTER — Ambulatory Visit (INDEPENDENT_AMBULATORY_CARE_PROVIDER_SITE_OTHER): Payer: BLUE CROSS/BLUE SHIELD | Admitting: Family Medicine

## 2015-06-13 ENCOUNTER — Ambulatory Visit (INDEPENDENT_AMBULATORY_CARE_PROVIDER_SITE_OTHER)
Admission: RE | Admit: 2015-06-13 | Discharge: 2015-06-13 | Disposition: A | Discharge status: Home / Self Care | Payer: BLUE CROSS/BLUE SHIELD | Source: Ambulatory Visit | Attending: Family Medicine | Admitting: Family Medicine

## 2015-06-13 VITALS — BP 124/68 | HR 58 | Temp 97.5°F | Wt 173.8 lb

## 2015-06-13 DIAGNOSIS — M79645 Pain in left finger(s): Secondary | ICD-10-CM

## 2015-06-13 DIAGNOSIS — F329 Major depressive disorder, single episode, unspecified: Secondary | ICD-10-CM

## 2015-06-13 DIAGNOSIS — M79646 Pain in unspecified finger(s): Secondary | ICD-10-CM | POA: Diagnosis not present

## 2015-06-13 DIAGNOSIS — F32A Depression, unspecified: Secondary | ICD-10-CM

## 2015-06-13 DIAGNOSIS — F418 Other specified anxiety disorders: Secondary | ICD-10-CM

## 2015-06-13 DIAGNOSIS — F419 Anxiety disorder, unspecified: Secondary | ICD-10-CM

## 2015-06-13 MED ORDER — BUSPIRONE HCL 10 MG PO TABS: 10.0000 mg | ORAL_TABLET | Freq: Two times a day (BID) | ORAL | Status: DC

## 2015-06-13 NOTE — Progress Notes (Signed)
Pre visit review using our clinic review tool, if applicable. No additional management support is needed unless otherwise documented below in the visit note.  Anxiety.  05/06/15 note reviewed from PCP.  Restarted on buspar at that poinat.  He is on buspar qd, asking about taking BID now.  Had helped prev.  No ADE on med.  Still with some anxiety, bothersome; a little better with qd dosing.    L 5th finger complaint.  Felt a vibration sensation in the L 5th finger.  He felt a pop when manipulating the finger and had pain radiate up the hand.  Noted yesterday at work.  No specific injury at work per patient report.  Finger was redder and puffier yesterday, better today.  No R hand complaints.   PMH and SH reviewed  ROS: See HPI, otherwise noncontributory.  Meds, vitals, and allergies reviewed.   nad Speech wnl Affect wnl Judgement wnl L 5th PIP slightly puffy, slightly ttp, not red.  No laxity but some pain with resisted flexion.  No pain on resisted extension.  NV intact distally.  Hand not ttp o/w, MCP and DIP not ttp.  No bruising.

## 2015-06-13 NOTE — Patient Instructions (Signed)
Use the splint for a few days and then gradually wean out of it.  That should help.  Use the twice a day dosing of buspar in the meantime and update Dr. Reece AgarG. Take care.  Glad to see you.

## 2015-06-16 ENCOUNTER — Encounter: Payer: Self-pay | Admitting: Family Medicine

## 2015-06-16 DIAGNOSIS — M79646 Pain in unspecified finger(s): Secondary | ICD-10-CM | POA: Insufficient documentation

## 2015-06-16 NOTE — Assessment & Plan Note (Signed)
xrays reviewed, no fx seen.  Splinted in extension at the OV.   Will Use the splint for a few days and then gradually wean out of it. Likely from a soft tissue injury/strain but no sign of fx or tendon failure.   Okay for outpatient f/u.

## 2015-06-16 NOTE — Assessment & Plan Note (Signed)
Inc to BID and then report back to PCP. New rx done.

## 2015-06-17 ENCOUNTER — Ambulatory Visit (INDEPENDENT_AMBULATORY_CARE_PROVIDER_SITE_OTHER): Payer: BLUE CROSS/BLUE SHIELD

## 2015-06-17 DIAGNOSIS — Z23 Encounter for immunization: Secondary | ICD-10-CM

## 2015-06-27 ENCOUNTER — Ambulatory Visit: Payer: Self-pay | Admitting: Nurse Practitioner

## 2015-06-27 ENCOUNTER — Ambulatory Visit (INDEPENDENT_AMBULATORY_CARE_PROVIDER_SITE_OTHER): Payer: BLUE CROSS/BLUE SHIELD | Admitting: Primary Care

## 2015-06-27 ENCOUNTER — Encounter: Payer: Self-pay | Admitting: Primary Care

## 2015-06-27 VITALS — BP 130/74 | HR 73 | Temp 97.6°F | Ht 72.0 in | Wt 174.8 lb

## 2015-06-27 DIAGNOSIS — M79646 Pain in unspecified finger(s): Secondary | ICD-10-CM

## 2015-06-27 DIAGNOSIS — M79645 Pain in left finger(s): Secondary | ICD-10-CM | POA: Diagnosis not present

## 2015-06-27 MED ORDER — PREDNISONE 20 MG PO TABS: ORAL_TABLET | ORAL | Status: DC

## 2015-06-27 NOTE — Assessment & Plan Note (Signed)
Increased swelling and pain to left 5th digit. Evaluated on 11/22, no abnormality on xray. No re-injury. Mild-moderate swelling to left 5th digit, decrease in ROM, also to left wrist due to pain. RX for short term prednisone taper provided.  Discussed to wean out of finger splint to prevent stiffening of joints. Tylenol PRN, ice, finger exercises. Follow up PRN.

## 2015-06-27 NOTE — Progress Notes (Signed)
Pre visit review using our clinic review tool, if applicable. No additional management support is needed unless otherwise documented below in the visit note. 

## 2015-06-27 NOTE — Patient Instructions (Signed)
Start Prednisone tablets for inflammation. Take 2 tablets daily for 3 days, then 1 tablet daily for 3 days.  You may take Tylenol as needed for pain. Do not exceed 3000 mg in 24 hours.  Once you've completed the prednisone, you may take ibuprofen 600 mg three times daily as needed for pain and inflammation.  Consider obtaining a wrist brace from the store to support your wrist and hand.  It was a pleasure to see you today!
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
## 2015-07-21 ENCOUNTER — Other Ambulatory Visit: Payer: Self-pay | Admitting: Family Medicine

## 2015-09-03 ENCOUNTER — Encounter: Payer: Self-pay | Admitting: Family Medicine

## 2015-09-03 ENCOUNTER — Telehealth: Payer: Self-pay | Admitting: Family Medicine

## 2015-09-03 ENCOUNTER — Ambulatory Visit (INDEPENDENT_AMBULATORY_CARE_PROVIDER_SITE_OTHER): Payer: BLUE CROSS/BLUE SHIELD | Admitting: Family Medicine

## 2015-09-03 VITALS — BP 118/64 | HR 84 | Temp 98.2°F | Ht 72.0 in | Wt 171.2 lb

## 2015-09-03 DIAGNOSIS — F411 Generalized anxiety disorder: Secondary | ICD-10-CM | POA: Diagnosis not present

## 2015-09-03 DIAGNOSIS — G8929 Other chronic pain: Secondary | ICD-10-CM | POA: Insufficient documentation

## 2015-09-03 DIAGNOSIS — R0602 Shortness of breath: Secondary | ICD-10-CM

## 2015-09-03 MED ORDER — BUPROPION HCL ER (SR) 150 MG PO TB12: ORAL_TABLET | ORAL | Status: DC

## 2015-09-03 MED ORDER — BUDESONIDE-FORMOTEROL FUMARATE 80-4.5 MCG/ACT IN AERO: 2.0000 | INHALATION_SPRAY | Freq: Two times a day (BID) | RESPIRATORY_TRACT | Status: DC

## 2015-09-03 NOTE — Progress Notes (Signed)
Pre visit review using our clinic review tool, if applicable. No additional management support is needed unless otherwise documented below in the visit note. 

## 2015-09-03 NOTE — Telephone Encounter (Signed)
Pt has appt 09/03/15 at 2:45 with  Dr Adriana Simas.

## 2015-09-03 NOTE — Patient Instructions (Signed)
Your exam was normal.  This may be related to your asthma with a component of anxiety.  I have started you on Wellbutrin to help with her anxiety as well as to help with smoking cessation.  Please follow-up closely with Dr. Reece Agar.  Take care  Dr. Adriana Simas

## 2015-09-03 NOTE — Assessment & Plan Note (Signed)
Established problem, worsening. Patient with long-standing anxiety. Appears to have symptoms of depression as well. Patient has multiple somatic complaints. Starting on Wellbutrin as it aids in smoking cessation as well.

## 2015-09-03 NOTE — Telephone Encounter (Signed)
Patient Name: Brian Spence  DOB: [birth year]    Initial Comment Caller states been very tired and body sore and achy, been having shortness of breath   Nurse Assessment  Nurse: Sherilyn Cooter, RN, Thurmond Butts Date/Time (Eastern Time): 09/03/2015 10:44:19 AM  Confirm and document reason for call. If symptomatic, describe symptoms. You must click the next button to save text entered. ---Caller states that he has been feeling tired and body soreness. This has been going on for a while. He has SOB with exertion and resting, but its worse when he is exerting himself. Denies racing heart with SOB when resting, only with activity. He has seen the doctor for these symptoms before. He also has some anxiety. He does not feel like he is having an attack at present. He was placed on an asthma inhaler and a rescue inhaler. Even when he uses his rescue inhaler, he still has SOB. He states that his breathing isn't feeling normal right not, but he doesn't think he has SOB at present. He was last seen for this back in August. He is speaking in complete sentences.  Has the patient traveled out of the country within the last 30 days? ---No  Does the patient have any new or worsening symptoms? ---Yes  Will a triage be completed? ---Yes  Related visit to physician within the last 2 weeks? ---No  Does the PT have any chronic conditions? (i.e. diabetes, asthma, etc.) ---Yes  List chronic conditions. ---Anxiety, Sports Induced Asthma  Is this a behavioral health or substance abuse call? ---No     Guidelines    Guideline Title Affirmed Question Affirmed Notes  Breathing Difficulty [1] MILD difficulty breathing (e.g., minimal/no SOB at rest, SOB with walking, pulse <100) AND [2] NEW-onset or WORSE than normal    Final Disposition User   See Physician within 4 Hours (or PCP triage) Sherilyn Cooter, RN, Thurmond Butts    Comments  No appointments for [name] nor any other doctor at [name]. I was able to schedule him for 2:45pm today with Dr.  [name] at [name].   Referrals  REFERRED TO PCP OFFICE   Disagree/Comply: Comply

## 2015-09-03 NOTE — Progress Notes (Signed)
Subjective:  Patient ID: Brian Spence, male    DOB: 01/09/1991  Age: 25 y.o. MRN: [id number]  CC: SOB, not breathing normally; Anxiety, chronic pain  HPI:  25 year old male presents with the above complaints.  Shortness of breath  Pateint reports that he's had intermittent shortness of breath particularly with exertion over the past several months.  He states that he feels like he is not breathing normally.  No associated fevers or chills.  He currently uses albuterol inhaler as needed.  He is out of his Symbicort.  Anxiety  Patient continues to have trouble with anxiety.  He is intermittently taking BuSpar.  He states that he's had little improvement with this.  Social Hx   Social History   Social History  . Marital Status: Single    Spouse Name: N/A  . Number of Children: N/A  . Years of Education: N/A   Social History Main Topics  . Smoking status: Current Every Day Smoker -- 1.00 packs/day    Types: Cigarettes  . Smokeless tobacco: Never Used  . Alcohol Use: 0.0 oz/week    0 Standard drinks or equivalent per week     Comment: Occasional  . Drug Use: No     Comment: MJ-recently stopped, last 04/2011  . Sexual Activity: Yes   Other Topics Concern  . None   Social History Narrative   Caffeine: rare   Lives with parents, brother and sister, 2 dogs   Occupation: Pension scheme manager at [employer], retail   Activity: no regular exercise   Diet: not healthy diet   Review of Systems  Constitutional: Negative.   Respiratory: Positive for cough and shortness of breath.   Musculoskeletal:       Chronic hip/back pain.  Psychiatric/Behavioral: The patient is nervous/anxious.    Objective:  BP 118/64 mmHg  Pulse 84  Temp(Src) 98.2 F (36.8 C) (Oral)  Ht 6' (1.829 m)  Wt 171 lb 4 oz (77.678 kg)  BMI 23.22 kg/m2  SpO2 98%  BP/Weight 09/03/2015 06/27/2015 06/13/2015  Systolic BP 118 130 124  Diastolic BP 64 74 68  Wt. (Lbs) 171.25 174.8 173.8  BMI 23.22  23.7 23.57   Physical Exam  Constitutional: He is oriented to person, place, and time. He appears well-developed. No distress.  HENT:  Mouth/Throat: Oropharynx is clear and moist.  Eyes: Conjunctivae are normal.  Cardiovascular: Normal rate and regular rhythm.   Pulmonary/Chest: Effort normal and breath sounds normal. No respiratory distress. He has no wheezes. He has no rales.  Neurological: He is alert and oriented to person, place, and time.  Psychiatric:  Flat affect, depressed mood.  Vitals reviewed.  Lab Results  Component Value Date   WBC 6.9 02/08/2013   HGB 14.5 02/08/2013   HCT 42.7 02/08/2013   PLT 231.0 02/08/2013   GLUCOSE 71 02/08/2013   ALT 8 08/21/2010   AST 19 08/21/2010   NA 137 02/08/2013   K 4.1 02/08/2013   CL 104 02/08/2013   CREATININE 0.9 02/08/2013   BUN 19 02/08/2013   CO2 28 02/08/2013   TSH 1.13 02/08/2013   Assessment & Plan:   Problem List Items Addressed This Visit    SOB (shortness of breath)    Established problem, worsening. Exam unremarkable today. Patient symptoms likely have a psychosomatic component. Symbicort refilled today.      Generalized anxiety disorder - Primary    Established problem, worsening. Patient with long-standing anxiety. Appears to have symptoms of depression as well. Patient  has multiple somatic complaints. Starting on Wellbutrin as it aids in smoking cessation as well.      Chronic pain    Patient brought up his chronic back/hip pain today. Defer to primary care physician.      Relevant Medications   buPROPion (WELLBUTRIN SR) 150 MG 12 hr tablet      Meds ordered this encounter  Medications  . budesonide-formoterol (SYMBICORT) 80-4.5 MCG/ACT inhaler    Sig: Inhale 2 puffs into the lungs 2 (two) times daily.    Dispense:  1 Inhaler    Refill:  11  . buPROPion (WELLBUTRIN SR) 150 MG 12 hr tablet    Sig: 150 mg once daily for 3 days; then increase to 150 mg twice daily.    Dispense:  60 tablet     Refill:  1    Follow-up: PRN  [name] DO [facility]

## 2015-09-03 NOTE — Assessment & Plan Note (Signed)
Established problem, worsening. Exam unremarkable today. Patient symptoms likely have a psychosomatic component. Symbicort refilled today.

## 2015-09-03 NOTE — Assessment & Plan Note (Signed)
Patient brought up his chronic back/hip pain today. Defer to primary care physician.

## 2015-11-10 ENCOUNTER — Ambulatory Visit (INDEPENDENT_AMBULATORY_CARE_PROVIDER_SITE_OTHER): Payer: BLUE CROSS/BLUE SHIELD | Admitting: Family Medicine

## 2015-11-10 ENCOUNTER — Encounter: Payer: Self-pay | Admitting: Family Medicine

## 2015-11-10 VITALS — BP 110/78 | HR 77 | Temp 98.0°F | Wt 177.0 lb

## 2015-11-10 DIAGNOSIS — M62838 Other muscle spasm: Secondary | ICD-10-CM

## 2015-11-10 DIAGNOSIS — M6248 Contracture of muscle, other site: Secondary | ICD-10-CM | POA: Diagnosis not present

## 2015-11-10 MED ORDER — TIZANIDINE HCL 4 MG PO TABS: 4.0000 mg | ORAL_TABLET | Freq: Three times a day (TID) | ORAL | Status: DC | PRN

## 2015-11-10 MED ORDER — NAPROXEN 500 MG PO TABS
ORAL_TABLET | ORAL | Status: DC
Start: 2015-11-10 — End: 2016-09-16

## 2015-11-10 NOTE — Assessment & Plan Note (Signed)
Paracervical and R trapezius muscle spasms without inciting trauma/injury.  Treat with NSAID and zanaflex, discussed sedation precautions.  Update if not improving with treatment as expected. Pt agrees with plan.
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
## 2015-11-10 NOTE — Progress Notes (Signed)
Pre visit review using our clinic review tool, if applicable. No additional management support is needed unless otherwise documented below in the visit note. 

## 2015-11-10 NOTE — Patient Instructions (Signed)
You have trapezius muscle and paracervical muscle strain/spasm at neck.  Treat with heating pad, naprosyn anti inflammatory for 5 days twice daily with meals, and zanaflex 4mg  twice daily with meals for 5 days then may use both as needed. Should improve with time.

## 2015-11-21 ENCOUNTER — Telehealth: Payer: Self-pay | Admitting: Family Medicine

## 2015-11-21 ENCOUNTER — Encounter: Payer: Self-pay | Admitting: Primary Care

## 2015-11-21 ENCOUNTER — Ambulatory Visit (INDEPENDENT_AMBULATORY_CARE_PROVIDER_SITE_OTHER): Payer: BLUE CROSS/BLUE SHIELD | Admitting: Primary Care

## 2015-11-21 VITALS — BP 108/68 | HR 60 | Temp 98.0°F | Ht 72.0 in | Wt 180.4 lb

## 2015-11-21 DIAGNOSIS — J309 Allergic rhinitis, unspecified: Secondary | ICD-10-CM | POA: Diagnosis not present

## 2015-11-21 MED ORDER — METHOCARBAMOL 500 MG PO TABS: 500.0000 mg | ORAL_TABLET | Freq: Three times a day (TID) | ORAL | Status: DC
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
## 2015-11-21 NOTE — Patient Instructions (Signed)
Your symptoms are likely related to allergies.  Continue Flonase. Start Zyrtec at bedtime to help with runny nose.   You may try Delsym DM for cough and congestion.  Increase fluids and rest.  It was a pleasure to see you today!

## 2015-11-21 NOTE — Telephone Encounter (Signed)
Pt is stating that his neck pain is not any better.  cb number for pt is [phone]

## 2015-11-21 NOTE — Telephone Encounter (Signed)
Use heating pad to neck 10-3515min three times daily regularly, don't place directly on skin. Stop zanaflex, start robaxin 500mg  three times daily. Sedation precautions - dont' take and drive.  If no better with new muscle relaxant we can offer physical therapy.

## 2015-11-24 NOTE — Telephone Encounter (Signed)
Message left for patient to return my call.  

## 2015-11-24 NOTE — Telephone Encounter (Signed)
Patient notified and verbalized understanding. 

## 2015-12-21 ENCOUNTER — Other Ambulatory Visit: Payer: Self-pay | Admitting: Family Medicine

## 2015-12-23 NOTE — Telephone Encounter (Signed)
[facility] Please advise this refill?

## 2016-01-02 ENCOUNTER — Other Ambulatory Visit: Payer: Self-pay | Admitting: Family Medicine

## 2016-01-05 NOTE — Telephone Encounter (Signed)
Medication has three refills left

## 2016-01-09 ENCOUNTER — Other Ambulatory Visit: Payer: Self-pay | Admitting: Orthopedic Surgery

## 2016-01-09 DIAGNOSIS — M5412 Radiculopathy, cervical region: Secondary | ICD-10-CM

## 2016-02-02 ENCOUNTER — Ambulatory Visit
Admission: RE | Admit: 2016-02-02 | Discharge: 2016-02-02 | Disposition: A | Discharge status: Home / Self Care | Payer: BLUE CROSS/BLUE SHIELD | Source: Ambulatory Visit | Attending: Orthopedic Surgery | Admitting: Orthopedic Surgery

## 2016-02-02 DIAGNOSIS — M5031 Other cervical disc degeneration,  high cervical region: Secondary | ICD-10-CM | POA: Insufficient documentation

## 2016-02-02 DIAGNOSIS — M5412 Radiculopathy, cervical region: Secondary | ICD-10-CM | POA: Insufficient documentation

## 2016-02-10 ENCOUNTER — Ambulatory Visit: Payer: Self-pay | Admitting: Family Medicine

## 2016-09-16 ENCOUNTER — Encounter (INDEPENDENT_AMBULATORY_CARE_PROVIDER_SITE_OTHER): Payer: Self-pay

## 2016-09-16 ENCOUNTER — Encounter: Payer: Self-pay | Admitting: Family Medicine

## 2016-09-16 ENCOUNTER — Ambulatory Visit (INDEPENDENT_AMBULATORY_CARE_PROVIDER_SITE_OTHER): Payer: BLUE CROSS/BLUE SHIELD | Admitting: Family Medicine

## 2016-09-16 VITALS — BP 118/68 | HR 60 | Temp 98.2°F | Wt 165.0 lb

## 2016-09-16 DIAGNOSIS — F411 Generalized anxiety disorder: Secondary | ICD-10-CM | POA: Diagnosis not present

## 2016-09-16 DIAGNOSIS — Z23 Encounter for immunization: Secondary | ICD-10-CM

## 2016-09-16 DIAGNOSIS — F17200 Nicotine dependence, unspecified, uncomplicated: Secondary | ICD-10-CM

## 2016-09-16 DIAGNOSIS — M7712 Lateral epicondylitis, left elbow: Secondary | ICD-10-CM | POA: Insufficient documentation

## 2016-09-16 DIAGNOSIS — R002 Palpitations: Secondary | ICD-10-CM

## 2016-09-16 MED ORDER — BUSPIRONE HCL 10 MG PO TABS: 10.0000 mg | ORAL_TABLET | Freq: Two times a day (BID) | ORAL | 6 refills | Status: DC

## 2016-09-16 NOTE — Progress Notes (Signed)
Pre visit review using our clinic review tool, if applicable. No additional management support is needed unless otherwise documented below in the visit note. 

## 2016-09-16 NOTE — Patient Instructions (Addendum)
Flu shot today EKG today I think you have tennis elbow. Do exercises provided, use tennis elbow strap when using left forearm. May take ibuprofen, aleve as needed for inflammation.  For anxiety - I recommend we restart buspar. Start 10mg  daily for 1 week then if tolerated, increase to 10mg  twice daily. Watch for dizziness.

## 2016-09-16 NOTE — Addendum Note (Signed)
Addended by: Josph MachoANCE, KIMBERLY A on: 09/16/2016 10:48 AM   Modules accepted: Orders

## 2016-09-16 NOTE — Assessment & Plan Note (Signed)
Anticipate L tennis elbow due to repetitive forearm motion at work. Discussed anticipated course of improvement. Provided with tennis elbow strap as well as stretching exercises from sports med patient advisor handbook. Discussed PRN NSAID use. rec avoid repetitive motions as much as able. Update if not improved with treatment.
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
## 2016-09-16 NOTE — Assessment & Plan Note (Addendum)
With attacks. Deteriorated off meds. Discussed stressors.  Will restart buspar 10mg  daily x 1 wk then increase to 10mg  bid.  RTC 2-3 mo f/u visit.  Consider counseling.  Check EKG today for palpitation.

## 2016-09-16 NOTE — Assessment & Plan Note (Signed)
Continue to encourage cessation. Precontemplative.  ?

## 2016-09-20 ENCOUNTER — Encounter: Payer: Self-pay | Admitting: *Deleted

## 2016-11-17 ENCOUNTER — Encounter (INDEPENDENT_AMBULATORY_CARE_PROVIDER_SITE_OTHER): Payer: Self-pay

## 2016-11-17 ENCOUNTER — Ambulatory Visit (INDEPENDENT_AMBULATORY_CARE_PROVIDER_SITE_OTHER): Payer: BLUE CROSS/BLUE SHIELD | Admitting: Primary Care

## 2016-11-17 ENCOUNTER — Encounter: Payer: Self-pay | Admitting: Primary Care

## 2016-11-17 VITALS — BP 120/74 | HR 65 | Temp 97.9°F | Ht 72.0 in | Wt 160.1 lb

## 2016-11-17 DIAGNOSIS — R05 Cough: Secondary | ICD-10-CM | POA: Diagnosis not present

## 2016-11-17 DIAGNOSIS — R059 Cough, unspecified: Secondary | ICD-10-CM

## 2016-11-17 MED ORDER — GUAIFENESIN-CODEINE 100-10 MG/5ML PO SOLN: 5.0000 mL | Freq: Every evening | ORAL | 0 refills | Status: DC | PRN

## 2016-11-17 NOTE — Progress Notes (Signed)
Pre visit review using our clinic review tool, if applicable. No additional management support is needed unless otherwise documented below in the visit note. 
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
## 2016-11-17 NOTE — Patient Instructions (Signed)
Continue the Augmentin antibiotics as prescribed.  Continue Flonase nasal spray for sinus pressure/nasal congestion. Continue the albuterol inhaler for cough and shortness of breath.  You may take the cough suppressant at bedtime as needed for cough and rest. Caution this medication contains codeine and will make you feel drowsy.  If not already doing so, start an antihistamine such as Claritin, Zyrtec, or Allegra. Zyrtec may cause drowsiness so take this at night.  It was a pleasure to see you today!

## 2016-11-26 DIAGNOSIS — J343 Hypertrophy of nasal turbinates: Secondary | ICD-10-CM | POA: Insufficient documentation

## 2016-11-26 DIAGNOSIS — J342 Deviated nasal septum: Secondary | ICD-10-CM | POA: Insufficient documentation

## 2016-12-27 ENCOUNTER — Telehealth: Payer: Self-pay | Admitting: Family Medicine

## 2016-12-27 NOTE — Telephone Encounter (Signed)
I spoke with pt for 2 days has had abd pain around belly button, diarrhea, ? Fever; last 2 nights pt has woke up with bed wet from sweat; pt is also nauseated; pain level now is 7 - 8.pt had drank red gatorade earlier today. Pt will go to UC now to be evaluated. FYI to Dr Reece AgarG.

## 2016-12-27 NOTE — Telephone Encounter (Signed)
Patient Name: Brian Spence  DOB: [birth year]    Initial Comment Caller states having stomach pain and diarrhea past 2 days; liquid and red; had red gatorade just a bit ago; rectum hurt really bad to have a BM even w/diarrhea BM.    Nurse Assessment  Nurse: Stefano GaulStringer, RN, Dwana CurdVera Date/Time (Eastern Time): 12/27/2016 1:43:33 PM  Confirm and document reason for call. If symptomatic, describe symptoms. ---Caller states he has had diarrhea and has constant abd pain today. Has had diarrhea and abd pain for 2 days. Has had at least 1 daily and probably more that that. Has had 5-6 diarrhea stools today that looks red. Pain level is mild to moderate. Has some nausea. Not sure if he has a fever. Had red gatorade earlier today.  Does the patient have any new or worsening symptoms? ---Yes  Will a triage be completed? ---Yes  Related visit to physician within the last 2 weeks? ---No  Does the PT have any chronic conditions? (i.e. diabetes, asthma, etc.) ---Yes  List chronic conditions. ---asthma  Is this a behavioral health or substance abuse call? ---No     Guidelines    Guideline Title Affirmed Question Affirmed Notes  Diarrhea [1] Blood in the stool AND [2] moderate or large amount of blood    Final Disposition User   Go to ED Now Stefano GaulStringer, RN, Dwana CurdVera    Comments  pt states he can not afford to go to the ER.  Called back line and spoke to FarwellRena and gave report that pt has had diarrhea and abd pain the past 2 days. Pain is constant today. had diarrhea stool and the toilet water had a lot of red. Triage outcome go to ER now but he states he can not afford to go to the ER.   Referrals  GO TO FACILITY REFUSED   Disagree/Comply: Disagree  Disagree/Comply Reason: Disagree with instructions

## 2017-04-18 ENCOUNTER — Encounter: Payer: Self-pay | Admitting: Family Medicine

## 2017-04-18 ENCOUNTER — Ambulatory Visit (INDEPENDENT_AMBULATORY_CARE_PROVIDER_SITE_OTHER): Payer: BLUE CROSS/BLUE SHIELD | Admitting: Family Medicine

## 2017-04-18 VITALS — BP 108/70 | HR 63 | Temp 98.5°F | Ht 72.0 in | Wt 169.5 lb

## 2017-04-18 DIAGNOSIS — Z720 Tobacco use: Secondary | ICD-10-CM | POA: Diagnosis not present

## 2017-04-18 DIAGNOSIS — J4531 Mild persistent asthma with (acute) exacerbation: Secondary | ICD-10-CM | POA: Diagnosis not present

## 2017-04-18 DIAGNOSIS — J069 Acute upper respiratory infection, unspecified: Secondary | ICD-10-CM | POA: Diagnosis not present

## 2017-04-18 MED ORDER — BUDESONIDE-FORMOTEROL FUMARATE 80-4.5 MCG/ACT IN AERO: 2.0000 | INHALATION_SPRAY | Freq: Two times a day (BID) | RESPIRATORY_TRACT | 2 refills | Status: DC

## 2017-04-18 MED ORDER — ALBUTEROL SULFATE HFA 108 (90 BASE) MCG/ACT IN AERS: 2.0000 | INHALATION_SPRAY | Freq: Four times a day (QID) | RESPIRATORY_TRACT | 2 refills | Status: DC | PRN
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
## 2017-07-15 ENCOUNTER — Ambulatory Visit: Payer: BLUE CROSS/BLUE SHIELD | Admitting: Family Medicine

## 2017-07-15 ENCOUNTER — Encounter: Payer: Self-pay | Admitting: Family Medicine

## 2017-07-15 ENCOUNTER — Encounter (INDEPENDENT_AMBULATORY_CARE_PROVIDER_SITE_OTHER): Payer: Self-pay

## 2017-07-15 VITALS — BP 116/64 | HR 54 | Temp 98.4°F | Wt 164.0 lb

## 2017-07-15 DIAGNOSIS — A072 Cryptosporidiosis: Secondary | ICD-10-CM | POA: Insufficient documentation

## 2017-07-15 DIAGNOSIS — F411 Generalized anxiety disorder: Secondary | ICD-10-CM | POA: Diagnosis not present

## 2017-07-15 DIAGNOSIS — R197 Diarrhea, unspecified: Secondary | ICD-10-CM

## 2017-07-15 DIAGNOSIS — R1030 Lower abdominal pain, unspecified: Secondary | ICD-10-CM | POA: Diagnosis not present

## 2017-07-15 HISTORY — DX: Cryptosporidiosis: A07.2

## 2017-07-15 LAB — COMPREHENSIVE METABOLIC PANEL
ALBUMIN: 4.2 g/dL (ref 3.5–5.2)
ALT: 37 U/L (ref 0–53)
AST: 30 U/L (ref 0–37)
Alkaline Phosphatase: 69 U/L (ref 39–117)
BUN: 18 mg/dL (ref 6–23)
CALCIUM: 8.9 mg/dL (ref 8.4–10.5)
CO2: 30 mEq/L (ref 19–32)
CREATININE: 0.86 mg/dL (ref 0.40–1.50)
Chloride: 103 mEq/L (ref 96–112)
GFR: 115.16 mL/min (ref >60.00)
Glucose, Bld: 87 mg/dL (ref 70–99)
Potassium: 4 mEq/L (ref 3.5–5.1)
Sodium: 137 mEq/L (ref 135–145)
Total Bilirubin: 0.5 mg/dL (ref 0.2–1.2)
Total Protein: 6.8 g/dL (ref 6.0–8.3)

## 2017-07-15 LAB — CBC WITH DIFFERENTIAL/PLATELET
BASOS PCT: 1.6 % (ref 0.0–3.0)
Basophils Absolute: 0.1 10*3/uL (ref 0.0–0.1)
EOS PCT: 2.8 % (ref 0.0–5.0)
Eosinophils Absolute: 0.1 10*3/uL (ref 0.0–0.7)
HEMATOCRIT: 40.8 % (ref 39.0–52.0)
HEMOGLOBIN: 13.9 g/dL (ref 13.0–17.0)
LYMPHS PCT: 54.5 % — AB (ref 12.0–46.0)
Lymphs Abs: 2.6 10*3/uL (ref 0.7–4.0)
MCHC: 33.9 g/dL (ref 30.0–36.0)
MCV: 92.7 fl (ref 78.0–100.0)
Monocytes Absolute: 0.6 10*3/uL (ref 0.1–1.0)
Monocytes Relative: 12 % (ref 3.0–12.0)
NEUTROS ABS: 1.4 10*3/uL (ref 1.4–7.7)
Neutrophils Relative %: 29.1 % — ABNORMAL LOW (ref 43.0–77.0)
Platelets: 220 10*3/uL (ref 150.0–400.0)
RBC: 4.4 Mil/uL (ref 4.22–5.81)
RDW: 13.5 % (ref 11.5–15.5)
WBC: 4.8 10*3/uL (ref 4.0–10.5)

## 2017-07-15 LAB — LIPASE: Lipase: 15 U/L (ref 11.0–59.0)

## 2017-07-15 LAB — SEDIMENTATION RATE: Sed Rate: 2 mm/hr (ref 0–15)

## 2017-07-15 MED ORDER — ONDANSETRON HCL 4 MG PO TABS: 4.0000 mg | ORAL_TABLET | Freq: Three times a day (TID) | ORAL | 0 refills | Status: DC | PRN

## 2017-07-15 MED ORDER — VENLAFAXINE HCL ER 37.5 MG PO CP24: 37.5000 mg | ORAL_CAPSULE | Freq: Every day | ORAL | 6 refills | Status: DC

## 2017-07-15 NOTE — Patient Instructions (Addendum)
For mood - start effexor 37.33m daily.  For abdominal pain and diarrhea - we will check labwork today. Pass by lab to pick up stool kit to check for blood in the stool. We will be in touch with results. May use zofran as needed for nausea. Return in 1 month for follow up visit, let u[place] if ongoing abdominal pain and diarrhea.

## 2017-07-15 NOTE — Progress Notes (Signed)
BP 116/64 (BP Location: Left Arm, Patient Position: Sitting, Cuff Size: Normal)   Pulse (!) 54   Temp 98.4 F (36.9 C) (Oral)   Wt 164 lb (74.4 kg)   SpO2 96%   BMI 22.24 kg/m    CC: abd pain, anxiety Subjective:    Patient ID: Brian Spence, male    DOB: [birth year], 26 y.o.   MRN: [id number]  HPI: BRALEN Spence is a 26 y.o. male presenting on 07/15/2017 for Abdominal Pain (constant pressure/sharp pain. Vomits almost daily, nausea daily and some diarrhea. Started last week. Tried Tums and Imodium) and Anxiety   1 wk ago had episode of loose bloody stool, associated with nausea/vomiting almost daily. Emesis NBNB. No appetite. Ongoing diarrhea and lower abdominal pain. No mucous in stool.   Has tried imodium and tums as well as maalox. No fevers/chills, URI sxs, cough, chest pain, new rashes, new joint pains.  Son recently sick with double ear infections. No recent travel. No new foods. No GI illness at home.   Ongoing anxiety previously tried buspar, sertraline and wellbutrin - didn't feel buspar was very effective, and didn't do well with sertraline (HA, dizziness). He has previously seen counselor. Family and work stressors. Promotion at work - more responsibilities. More trouble around people, trouble socializing.   Endorses fleeting SI without plan. "I would never go through with it though."   Rare alcohol.  MJ every other day Denies other recreational drugs.   Relevant past medical, surgical, family and social history reviewed and updated as indicated. Interim medical history since our last visit reviewed. Allergies and medications reviewed and updated. Outpatient Medications Prior to Visit  Medication Sig Dispense Refill  . albuterol (PROVENTIL HFA;VENTOLIN HFA) 108 (90 Base) MCG/ACT inhaler Inhale 2 puffs into the lungs every 6 (six) hours as needed for wheezing. 1 Inhaler 2  . budesonide-formoterol (SYMBICORT) 80-4.5 MCG/ACT inhaler Inhale 2 puffs into the  lungs 2 (two) times daily. 1 Inhaler 2  . fluticasone (FLONASE) 50 MCG/ACT nasal spray Place 2 sprays into both nostrils daily. (Patient taking differently: Place 2 sprays into both nostrils daily as needed for allergies. ) 16 g 3   No facility-administered medications prior to visit.      Per HPI unless specifically indicated in ROS section below Review of Systems     Objective:    BP 116/64 (BP Location: Left Arm, Patient Position: Sitting, Cuff Size: Normal)   Pulse (!) 54   Temp 98.4 F (36.9 C) (Oral)   Wt 164 lb (74.4 kg)   SpO2 96%   BMI 22.24 kg/m   Wt Readings from Last 3 Encounters:  07/15/17 164 lb (74.4 kg)  04/18/17 169 lb 8 oz (76.9 kg)  11/17/16 160 lb 1.9 oz (72.6 kg)    Physical Exam  Constitutional: He appears well-developed and well-nourished. No distress.  HENT:  Mouth/Throat: Oropharynx is clear and moist. No oropharyngeal exudate.  Eyes: Conjunctivae are normal. Pupils are equal, round, and reactive to light.  Cardiovascular: Normal rate, regular rhythm, normal heart sounds and intact distal pulses.  No murmur heard. Pulmonary/Chest: Effort normal and breath sounds normal. No respiratory distress. He has no wheezes. He has no rales.  Abdominal: Soft. Normal appearance and bowel sounds are normal. He exhibits no distension and no mass. There is no hepatosplenomegaly. There is generalized tenderness. There is no rigidity, no rebound, no guarding, no CVA tenderness and negative Murphy's sign.  Musculoskeletal: He exhibits no edema.  Skin: Skin  is warm and dry. No rash noted.  Psychiatric: He has a normal mood and affect.  Nursing note and vitals reviewed.  GAD 7 : Generalized Anxiety Score 07/15/2017  Nervous, Anxious, on Edge 3  Control/stop worrying 3  Worry too much - different things 3  Trouble relaxing 3  Restless 3  Easily annoyed or irritable 3  Afraid - awful might happen 2  Total GAD 7 Score 20   Depression screen PHQ 2/9 07/15/2017    Decreased Interest 2  Down, Depressed, Hopeless 3  PHQ - 2 Score 5  Altered sleeping 3  Tired, decreased energy 3  Change in appetite 3  Feeling bad or failure about yourself  3  Trouble concentrating 2  Moving slowly or fidgety/restless 3  Suicidal thoughts 1  PHQ-9 Score 23      Assessment & Plan:   Problem List Items Addressed This Visit    Diarrhea - Primary    1 wk of diarrhea associated with vomiting and abdominal discomfort. ?infectious colitis. Check labs today. Will also check GI pathogen panel. Rx zofran for nausea. Further supportive care reviewed.  Check ESR to eval for IBD.      Relevant Orders   Fecal occult blood, imunochemical   Comprehensive metabolic panel   CBC with Differential/Platelet   Lipase   Sedimentation rate   Gastrointestinal Pathogen Panel PCR   Generalized anxiety disorder    Recurrent and worsening.  Has tried and failed several medications in the past.  Will trial effexor 37.18m XR daily.  Reviewed possible increased suicidality.  RTC 1 mo f/u visit.       Relevant Medications   venlafaxine XR (EFFEXOR XR) 37.5 MG 24 hr capsule    Other Visit Diagnoses    Lower abdominal pain       Relevant Orders   Fecal occult blood, imunochemical   Comprehensive metabolic panel   CBC with Differential/Platelet   Lipase   Sedimentation rate       Follow up plan: Return in about 4 weeks (around 08/12/2017), or if symptoms worsen or fail to improve, for follow up visit.  JRia Bush MD

## 2017-07-15 NOTE — Assessment & Plan Note (Signed)
Recurrent and worsening.  Has tried and failed several medications in the past.  Will trial effexor 37.5mg  XR daily.  Reviewed possible increased suicidality.  RTC 1 mo f/u visit.

## 2017-07-15 NOTE — Assessment & Plan Note (Addendum)
1 wk of diarrhea associated with vomiting and abdominal discomfort. ?infectious colitis. Check labs today. Will also check GI pathogen panel. Rx zofran for nausea. Further supportive care reviewed.  Check ESR to eval for IBD.

## 2017-07-18 NOTE — Addendum Note (Signed)
Addended by: Alvina ChouWALSH, Bailee Thall J on: 07/18/2017 09:22 AM   Modules accepted: Orders

## 2017-07-28 ENCOUNTER — Ambulatory Visit: Payer: Self-pay | Admitting: *Deleted

## 2017-07-28 NOTE — Telephone Encounter (Signed)
Noted  

## 2017-07-28 NOTE — Telephone Encounter (Signed)
Brian PurlMaryann Spence also noted; Pt sent to ED. 10/10 abdominal pain, vomiting, watery diarrhea x 48 hrs. Symptoms present x 1 month, vomiting daily. Seen in office 07/15/17.

## 2017-07-28 NOTE — Telephone Encounter (Signed)
Pt. reports 7-10/10 abdominal pain, was intermittent, now constant. Pain and vomiting x 1 month. Seen in office 07/15/17, placed on Zofran and Effexor. States has since been vomiting "at least daily" since that time. Also has been taking Imodium. No fever but chills and sweating. Reports "pink flecks" in emesis 2 days ago. Also states stomach had been "somewhat swollen but not now."  Diarrhea has started this am 0300. States stool watery, dark brown. Has not been able to keep anything down PO x 48 hours.  All symptoms have worsened last 48hrs. Directed to ED, GF will drive.  Reason for Disposition . [1] SEVERE pain (e.g., excruciating) AND [2] present > 1 hour  Answer Assessment - Initial Assessment Questions 1. LOCATION: "Where does it hurt?"      Entire abdomen, cramping 2. RADIATION: "Does the pain shoot anywhere else?" (e.g., chest, back)     Back 3. ONSET: "When did the pain begin?" (Minutes, hours or days ago)      Intermittently for 1 month 4. SUDDEN: "Gradual or sudden onset?"      5. PATTERN "Does the pain come and go, or is it constant?"    - If constant: "Is it getting better, staying the same, or worsening?"      (Note: Constant means the pain never goes away completely; most serious pain is constant and it progresses)     - If intermittent: "How long does it last?" "Do you have pain now?"     (Note: Intermittent means the pain goes away completely between bouts)     Constant this am 6. SEVERITY: "How bad is the pain?"  (e.g., Scale 1-10; mild, moderate, or severe)    - MILD (1-3): doesn't interfere with normal activities, abdomen soft and not tender to touch     - MODERATE (4-7): interferes with normal activities or awakens from sleep, tender to touch     - SEVERE (8-10): excruciating pain, doubled over, unable to do any normal activities       7-10/10 7. RECURRENT SYMPTOM: "Have you ever had this type of abdominal pain before?" If so, ask: "When was the last time?" and "What  happened that time?"      07/15/17 8. CAUSE: "What do you think is causing the abdominal pain?"     Not sure; seen 07/15/17 placed on Zofran and Effexor.  9. RELIEVING/AGGRAVATING FACTORS: "What makes it better or worse?" (e.g., movement, antacids, bowel movement)     Has taken Immodium 10. OTHER SYMPTOMS: "Has there been any vomiting, diarrhea, constipation, or urine problems?"      Vomiting intermittently x 1 month, few days ago "pink flecks". Diarrhea started today, watery, dark brown.  Protocols used: ABDOMINAL PAIN - MALE-A-AH

## 2017-07-29 ENCOUNTER — Encounter: Payer: Self-pay | Admitting: Family Medicine

## 2017-07-29 ENCOUNTER — Ambulatory Visit: Payer: BLUE CROSS/BLUE SHIELD | Admitting: Family Medicine

## 2017-07-29 DIAGNOSIS — K529 Noninfective gastroenteritis and colitis, unspecified: Secondary | ICD-10-CM

## 2017-07-29 MED ORDER — ONDANSETRON 4 MG PO TBDP: 4.0000 mg | ORAL_TABLET | Freq: Three times a day (TID) | ORAL | 0 refills | Status: DC | PRN

## 2017-07-29 NOTE — Progress Notes (Signed)
Former smoker, recently quit.  I thanked him for his effort.   Sx started about 2-3 days ago.  Vomiting mult times.  Diarrhea most of the day yesterday, 3-4 times today.  Watery stools.  Diffuse aches.  Not sleeping well.  Feels hot and cold.  Fatigued.  Not SOB.  Prev with some abd pain, clearly better today.  Fecal urgency.  Can get lightheaded.  Still making urine.  Drinking a lot of fluids.  Taking a bland diet.  Able to hold down food today, only some residual nausea.    No one else sick at home.  Recent AGE illness noted in the community, per report.    Meds, vitals, and allergies reviewed.   ROS: Per HPI unless specifically indicated in ROS section   GEN: nad, alert and oriented HEENT: mucous membranes moist NECK: supple w/o LA CV: rrr. PULM: ctab, no inc wob ABD: soft, +bs EXT: no edema SKIN: no acute rash

## 2017-07-29 NOTE — Patient Instructions (Signed)
Take zofran as needed for nausea and vomiting.  Rest and fluids.  Drink plenty of fluids.  Update us as needed.  Out of work for now.  Take care.  Glad to see you.

## 2017-07-31 NOTE — Assessment & Plan Note (Signed)
Likely benign viral AGE. D/w pt.  Better today.  Take zofran as needed for nausea and vomiting.  Rest and fluids.  Drink plenty of fluids.  Update us as needed.  Out of work for now.  He agrees.

## 2017-08-01 ENCOUNTER — Telehealth: Payer: Self-pay | Admitting: Family Medicine

## 2017-08-01 NOTE — Telephone Encounter (Signed)
Pt was seen on 1/3 by Dr. Para Marchuncan and was diagnosed with acute gastroenteritis. Pt calling back today stating that he feels foggy and weak, feels that his chest is feels heavy due to anxiety and is having "pain all over".Pt states he still has nausea and has only had emesis x 1 today and that Zofran has helped a little. Pt states he still has diarrhea and has been approximately 10 times today. Pt states he has been able to eat soup, crackers and bread without throwing that back up. Pt states he was told to call the office if he was not feeling better.

## 2017-08-01 NOTE — Telephone Encounter (Signed)
I would get the GI panel done- prev ordered by Dr. Darnell Level.  Please make sure he has the kit to complete that.  If not, then please get him another kit.   Keep using zofran in the meantime.  If any bloody stools then let me know so we can set up GI referral vs CT scan.  Thanks.

## 2017-08-01 NOTE — Telephone Encounter (Signed)
Patient advised.

## 2017-08-02 ENCOUNTER — Ambulatory Visit (INDEPENDENT_AMBULATORY_CARE_PROVIDER_SITE_OTHER): Payer: BLUE CROSS/BLUE SHIELD | Admitting: Family Medicine

## 2017-08-02 ENCOUNTER — Encounter: Payer: Self-pay | Admitting: Family Medicine

## 2017-08-02 VITALS — BP 124/70 | HR 116 | Temp 98.7°F | Wt 165.0 lb

## 2017-08-02 DIAGNOSIS — F411 Generalized anxiety disorder: Secondary | ICD-10-CM

## 2017-08-02 DIAGNOSIS — R1030 Lower abdominal pain, unspecified: Secondary | ICD-10-CM | POA: Diagnosis not present

## 2017-08-02 DIAGNOSIS — R197 Diarrhea, unspecified: Secondary | ICD-10-CM | POA: Diagnosis not present

## 2017-08-02 LAB — CBC WITH DIFFERENTIAL/PLATELET
BASOS PCT: 0.8 % (ref 0.0–3.0)
Basophils Absolute: 0.1 10*3/uL (ref 0.0–0.1)
Eosinophils Absolute: 0.1 10*3/uL (ref 0.0–0.7)
Eosinophils Relative: 1.6 % (ref 0.0–5.0)
HCT: 44.5 % (ref 39.0–52.0)
Hemoglobin: 14.9 g/dL (ref 13.0–17.0)
LYMPHS ABS: 2.7 10*3/uL (ref 0.7–4.0)
Lymphocytes Relative: 37.6 % (ref 12.0–46.0)
MCHC: 33.4 g/dL (ref 30.0–36.0)
MCV: 90.6 fl (ref 78.0–100.0)
Monocytes Absolute: 0.7 10*3/uL (ref 0.1–1.0)
Monocytes Relative: 9.8 % (ref 3.0–12.0)
NEUTROS ABS: 3.6 10*3/uL (ref 1.4–7.7)
NEUTROS PCT: 50.2 % (ref 43.0–77.0)
Platelets: 305 10*3/uL (ref 150.0–400.0)
RBC: 4.92 Mil/uL (ref 4.22–5.81)
RDW: 12.9 % (ref 11.5–15.5)
WBC: 7.2 10*3/uL (ref 4.0–10.5)

## 2017-08-02 LAB — COMPREHENSIVE METABOLIC PANEL
ALT: 31 U/L (ref 0–53)
AST: 27 U/L (ref 0–37)
Albumin: 4.4 g/dL (ref 3.5–5.2)
Alkaline Phosphatase: 66 U/L (ref 39–117)
BUN: 15 mg/dL (ref 6–23)
CHLORIDE: 104 meq/L (ref 96–112)
CO2: 31 mEq/L (ref 19–32)
Calcium: 9.2 mg/dL (ref 8.4–10.5)
Creatinine, Ser: 0.9 mg/dL (ref 0.40–1.50)
GFR: 109.23 mL/min (ref >60.00)
GLUCOSE: 73 mg/dL (ref 70–99)
POTASSIUM: 4.3 meq/L (ref 3.5–5.1)
SODIUM: 139 meq/L (ref 135–145)
TOTAL PROTEIN: 7.1 g/dL (ref 6.0–8.3)
Total Bilirubin: 0.4 mg/dL (ref 0.2–1.2)

## 2017-08-02 MED ORDER — HYDROXYZINE HCL 25 MG PO TABS: 12.5000 mg | ORAL_TABLET | Freq: Two times a day (BID) | ORAL | 0 refills | Status: DC | PRN

## 2017-08-02 MED ORDER — METRONIDAZOLE 500 MG PO TABS: 500.0000 mg | ORAL_TABLET | Freq: Three times a day (TID) | ORAL | 0 refills | Status: DC

## 2017-08-02 MED ORDER — CIPROFLOXACIN HCL 500 MG PO TABS: 500.0000 mg | ORAL_TABLET | Freq: Two times a day (BID) | ORAL | 0 refills | Status: DC

## 2017-08-02 NOTE — Addendum Note (Signed)
Addended by: Alvina ChouWALSH, Marlaine Arey J on: 08/02/2017 02:45 PM   Modules accepted: Orders

## 2017-08-02 NOTE — Assessment & Plan Note (Signed)
Ongoing watery foul smelling diarrhea, vomiting and nausea over last 3 wks now. Anticipate infectious colitis, concern for C diff - he has not submitted GI pathogen panel yet - asked he do this right away. Update CMP and CBC given chronicity. Will treat empirically with cipro/flagyl once he returns pathogen panel. If unrevealing panel, will check CT abd/pelvis. Pt agrees with plan.

## 2017-08-02 NOTE — Assessment & Plan Note (Addendum)
Worsening - will stop effexor given concommitant sxs, consider retrial once GI symptoms have resolved.  Will Rx PRN hydroxyzine, discussed sedation precautions.

## 2017-08-02 NOTE — Progress Notes (Addendum)
BP 124/70   Pulse (!) 116   Temp 98.7 F (37.1 C) (Oral)   Wt 165 lb (74.8 kg)   BMI 22.38 kg/m    CC: persistent vomiting, diarrhea, abd pain Subjective:    Patient ID: Brian Spence, male    DOB: 1990-09-28, [age] y.o.   MRN: [id number]  HPI: Brian Spence is a 27 y.o. male presenting on 08/02/2017 for not any better (walked in, stomach pain, diarrhea and vomitting)   See prior notes for details.  Ongoing vomiting, nausea, foul smelling watery diarrhea, gassiness, no appetite. Ongoing for 3 weeks, never improved and progressively worsening  No weight loss. No fevers, no blood in stool.  Ongoing body aches - worsening as well.  Anxiety worse.  Out of work due to illness.   Relevant past medical, surgical, family and social history reviewed and updated as indicated. Interim medical history since our last visit reviewed. Allergies and medications reviewed and updated. Outpatient Medications Prior to Visit  Medication Sig Dispense Refill  . albuterol (PROVENTIL HFA;VENTOLIN HFA) 108 (90 Base) MCG/ACT inhaler Inhale 2 puffs into the lungs every 6 (six) hours as needed for wheezing. 1 Inhaler 2  . budesonide-formoterol (SYMBICORT) 80-4.5 MCG/ACT inhaler Inhale 2 puffs into the lungs 2 (two) times daily. 1 Inhaler 2  . fluticasone (FLONASE) 50 MCG/ACT nasal spray Place 2 sprays into both nostrils daily. (Patient taking differently: Place 2 sprays into both nostrils daily as needed for allergies. ) 16 g 3  . ondansetron (ZOFRAN ODT) 4 MG disintegrating tablet Take 1 tablet (4 mg total) by mouth every 8 (eight) hours as needed for nausea or vomiting. 20 tablet 0  . venlafaxine XR (EFFEXOR XR) 37.5 MG 24 hr capsule Take 1 capsule (37.5 mg total) by mouth daily with breakfast. 30 capsule 6   No facility-administered medications prior to visit.      Per HPI unless specifically indicated in ROS section below Review of Systems     Objective:    BP 124/70   Pulse (!) 116   Temp  98.7 F (37.1 C) (Oral)   Wt 165 lb (74.8 kg)   BMI 22.38 kg/m   Wt Readings from Last 3 Encounters:  08/02/17 165 lb (74.8 kg)  07/29/17 161 lb 12 oz (73.4 kg)  07/15/17 164 lb (74.4 kg)    Physical Exam  Constitutional: He appears well-nourished. No distress.  Abdominal: Soft. Normal appearance and bowel sounds are normal. He exhibits no distension and no mass. There is generalized tenderness. There is no rigidity, no rebound, no guarding and negative Murphy's sign.  Skin: Skin is warm and dry. No rash noted.  Psychiatric: He has a normal mood and affect.  Nursing note and vitals reviewed.  Results for orders placed or performed in visit on 07/15/17  Comprehensive metabolic panel  Result Value Ref Range   Sodium 137 135 - 145 mEq/L   Potassium 4.0 3.5 - 5.1 mEq/L   Chloride 103 96 - 112 mEq/L   CO2 30 19 - 32 mEq/L   Glucose, Bld 87 70 - 99 mg/dL   BUN 18 6 - 23 mg/dL   Creatinine, Ser 1.910.86 0.40 - 1.50 mg/dL   Total Bilirubin 0.5 0.2 - 1.2 mg/dL   Alkaline Phosphatase 69 39 - 117 U/L   AST 30 0 - 37 U/L   ALT 37 0 - 53 U/L   Total Protein 6.8 6.0 - 8.3 g/dL   Albumin 4.2 3.5 - 5.2 g/dL  Calcium 8.9 8.4 - 10.5 mg/dL   GFR 454.09 >81.19 mL/min  CBC with Differential/Platelet  Result Value Ref Range   WBC 4.8 4.0 - 10.5 K/uL   RBC 4.40 4.22 - 5.81 Mil/uL   Hemoglobin 13.9 13.0 - 17.0 g/dL   HCT 14.7 82.9 - 56.2 %   MCV 92.7 78.0 - 100.0 fl   MCHC 33.9 30.0 - 36.0 g/dL   RDW 13.0 86.5 - 78.4 %   Platelets 220.0 150.0 - 400.0 K/uL   Neutrophils Relative % 29.1 (L) 43.0 - 77.0 %   Lymphocytes Relative 54.5 (H) 12.0 - 46.0 %   Monocytes Relative 12.0 3.0 - 12.0 %   Eosinophils Relative 2.8 0.0 - 5.0 %   Basophils Relative 1.6 0.0 - 3.0 %   Neutro Abs 1.4 1.4 - 7.7 K/uL   Lymphs Abs 2.6 0.7 - 4.0 K/uL   Monocytes Absolute 0.6 0.1 - 1.0 K/uL   Eosinophils Absolute 0.1 0.0 - 0.7 K/uL   Basophils Absolute 0.1 0.0 - 0.1 K/uL  Lipase  Result Value Ref Range   Lipase  15.0 11.0 - 59.0 U/L  Sedimentation rate  Result Value Ref Range   Sed Rate 2 0 - 15 mm/hr      Assessment & Plan:  Over 25 minutes were spent face-to-face with the patient during this encounter and >50% of that time was spent on counseling and coordination of care  Problem List Items Addressed This Visit    Diarrhea - Primary    Ongoing watery foul smelling diarrhea, vomiting and nausea over last 3 wks now. Anticipate infectious colitis, concern for C diff - he has not submitted GI pathogen panel yet - asked he do this right away. Update CMP and CBC given chronicity. Will treat empirically with cipro/flagyl once he returns pathogen panel. If unrevealing panel, will check CT abd/pelvis. Pt agrees with plan.       Relevant Orders   Comprehensive metabolic panel   CBC with Differential/Platelet   Generalized anxiety disorder    Worsening - will stop effexor given concommitant sxs, consider retrial once GI symptoms have resolved.  Will Rx PRN hydroxyzine, discussed sedation precautions.       Relevant Medications   hydrOXYzine (ATARAX/VISTARIL) 25 MG tablet       Follow up plan: No Follow-up on file.  [name], MD

## 2017-08-02 NOTE — Patient Instructions (Addendum)
Stop effexor. May try vistaril (hydroxyzine) as needed for anxiety in the interim.  Turn in GI stool test.  Labs today.  After you turn in stool test, start empiric antibiotic sent to pharmacy.

## 2017-08-04 ENCOUNTER — Other Ambulatory Visit: Payer: Self-pay

## 2017-08-04 ENCOUNTER — Ambulatory Visit: Payer: Self-pay | Admitting: *Deleted

## 2017-08-04 ENCOUNTER — Emergency Department
Admission: EM | Admit: 2017-08-04 | Discharge: 2017-08-04 | Disposition: A | Discharge status: Home / Self Care | Payer: BLUE CROSS/BLUE SHIELD | Attending: Emergency Medicine | Admitting: Emergency Medicine

## 2017-08-04 ENCOUNTER — Encounter: Payer: Self-pay | Admitting: Emergency Medicine

## 2017-08-04 DIAGNOSIS — Z87891 Personal history of nicotine dependence: Secondary | ICD-10-CM | POA: Diagnosis not present

## 2017-08-04 DIAGNOSIS — F329 Major depressive disorder, single episode, unspecified: Secondary | ICD-10-CM | POA: Insufficient documentation

## 2017-08-04 DIAGNOSIS — A072 Cryptosporidiosis: Secondary | ICD-10-CM | POA: Diagnosis not present

## 2017-08-04 DIAGNOSIS — R195 Other fecal abnormalities: Secondary | ICD-10-CM | POA: Diagnosis present

## 2017-08-04 DIAGNOSIS — J45909 Unspecified asthma, uncomplicated: Secondary | ICD-10-CM | POA: Insufficient documentation

## 2017-08-04 DIAGNOSIS — F419 Anxiety disorder, unspecified: Secondary | ICD-10-CM | POA: Diagnosis not present

## 2017-08-04 DIAGNOSIS — Z79899 Other long term (current) drug therapy: Secondary | ICD-10-CM | POA: Insufficient documentation

## 2017-08-04 LAB — GASTROINTESTINAL PANEL BY PCR, STOOL (REPLACES STOOL CULTURE)
ADENOVIRUS F40/41: NOT DETECTED
ASTROVIRUS: NOT DETECTED
Campylobacter species: NOT DETECTED
Cryptosporidium: DETECTED — AB
Cyclospora cayetanensis: NOT DETECTED
ENTAMOEBA HISTOLYTICA: NOT DETECTED
ENTEROAGGREGATIVE E COLI (EAEC): NOT DETECTED
ENTEROTOXIGENIC E COLI (ETEC): NOT DETECTED
Enteropathogenic E coli (EPEC): NOT DETECTED
GIARDIA LAMBLIA: NOT DETECTED
NOROVIRUS GI/GII: NOT DETECTED
Plesimonas shigelloides: NOT DETECTED
Rotavirus A: NOT DETECTED
Salmonella species: NOT DETECTED
Sapovirus (I, II, IV, and V): NOT DETECTED
Shiga like toxin producing E coli (STEC): NOT DETECTED
Shigella/Enteroinvasive E coli (EIEC): NOT DETECTED
VIBRIO CHOLERAE: NOT DETECTED
Vibrio species: NOT DETECTED
Yersinia enterocolitica: NOT DETECTED

## 2017-08-04 LAB — COMPREHENSIVE METABOLIC PANEL
ALBUMIN: 4.5 g/dL (ref 3.5–5.0)
ALT: 31 U/L (ref 17–63)
ANION GAP: 8 (ref 5–15)
AST: 28 U/L (ref 15–41)
Alkaline Phosphatase: 76 U/L (ref 38–126)
BILIRUBIN TOTAL: 0.4 mg/dL (ref 0.3–1.2)
BUN: 21 mg/dL — ABNORMAL HIGH (ref 6–20)
CO2: 25 mmol/L (ref 22–32)
Calcium: 8.7 mg/dL — ABNORMAL LOW (ref 8.9–10.3)
Chloride: 103 mmol/L (ref 101–111)
Creatinine, Ser: 0.9 mg/dL (ref 0.61–1.24)
GFR calc Af Amer: >60 mL/min (ref >60)
GLUCOSE: 82 mg/dL (ref 65–99)
POTASSIUM: 3.9 mmol/L (ref 3.5–5.1)
Sodium: 136 mmol/L (ref 135–145)
TOTAL PROTEIN: 7.6 g/dL (ref 6.5–8.1)

## 2017-08-04 LAB — DIFFERENTIAL
Basophils Absolute: 0.1 10*3/uL (ref 0–0.1)
Basophils Relative: 1 %
EOS PCT: 2 %
Eosinophils Absolute: 0.1 10*3/uL (ref 0–0.7)
LYMPHS PCT: 30 %
Lymphs Abs: 2.7 10*3/uL (ref 1.0–3.6)
MONO ABS: 0.8 10*3/uL (ref 0.2–1.0)
Monocytes Relative: 9 %
NEUTROS ABS: 5.1 10*3/uL (ref 1.4–6.5)
NEUTROS PCT: 58 %

## 2017-08-04 LAB — TYPE AND SCREEN
ABO/RH(D): A POS
ANTIBODY SCREEN: NEGATIVE

## 2017-08-04 LAB — CBC
HEMATOCRIT: 44.6 % (ref 40.0–52.0)
HEMOGLOBIN: 15.1 g/dL (ref 13.0–18.0)
MCH: 29.7 pg (ref 26.0–34.0)
MCHC: 33.7 g/dL (ref 32.0–36.0)
MCV: 88.1 fL (ref 80.0–100.0)
Platelets: 318 10*3/uL (ref 150–440)
RBC: 5.07 MIL/uL (ref 4.40–5.90)
RDW: 12.9 % (ref 11.5–14.5)
WBC: 8.8 10*3/uL (ref 3.8–10.6)

## 2017-08-04 LAB — C DIFFICILE QUICK SCREEN W PCR REFLEX
C DIFFICILE (CDIFF) TOXIN: NEGATIVE
C DIFFICLE (CDIFF) ANTIGEN: NEGATIVE
C Diff interpretation: NOT DETECTED

## 2017-08-04 LAB — RAPID HIV SCREEN (HIV 1/2 AB+AG)
HIV 1/2 Antibodies: NONREACTIVE
HIV-1 P24 Antigen - HIV24: NONREACTIVE

## 2017-08-04 LAB — OCCULT BLOOD X 1 CARD TO LAB, STOOL: FECAL OCCULT BLD: NEGATIVE

## 2017-08-04 NOTE — ED Triage Notes (Signed)
Pt reports generalized abdominal pain with NVD for month, pt reports rectal bleeding today. Pt reports has only had two formed BM in the past month. Ambulatory to triage, no apparent distress noted.

## 2017-08-04 NOTE — Telephone Encounter (Signed)
Pt called because he is having bloody diarrhea stools since Dec. He states he has n/v and abd pain.  He feels weak and lightheaded.  He has had more than 20 stools in the last 24 hours.  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 Per protocol he is suppose to see his pcp within 24 hours.  He wants to be seen today. He is going to the ED to be assessed.  Reason for Disposition . MODERATE rectal bleeding (small blood clots, passing blood without stool, or toilet water turns red)  Answer Assessment - Initial Assessment Questions 1. APPEARANCE of BLOOD: "What color is it?" "Is it passed separately, on the surface of the stool, or mixed in with the stool?"      Mixed in with the stool 2. AMOUNT: "How much blood was passed?"      The first time was a whole lot but decreasing with each stool now 3. FREQUENCY: "How many times has blood been passed with the stools?"      2 stools today 4. ONSET: "When was the blood first seen in the stools?" (Days or weeks)      Dec 3rd 5. DIARRHEA: "Is there also some diarrhea?" If so, ask: "How many diarrhea stools were passed in past 24 hours?"      Yes diarrhea and has had more than 20 in the last 24 hours 6. CONSTIPATION: "Do you have constipation?" If so, "How bad is it?"     no 7. RECURRENT SYMPTOMS: "Have you had blood in your stools before?" If so, ask: "When was the last time?" and "What happened that time?"      Yes in Dec for the first time 8. BLOOD THINNERS: "Do you take any blood thinners?" (e.g., Coumadin/warfarin, Pradaxa/dabigatran, aspirin)     no 9. OTHER SYMPTOMS: "Do you have any other symptoms?"  (e.g., abdominal pain, vomiting, dizziness, fever)     Abdominal pain, lightheadedness and dizziness, feeling hot and cold and sweating 10. PREGNANCY: "Is there any chance you are pregnant?" "When was your last menstrual period?"       n/a  Protocols used: RECTAL BLEEDING-A-AH
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
## 2017-08-05 IMAGING — MR MR CERVICAL SPINE W/O CM
5 series · 34 of 48 positions shown · non-contrast
Comparison: Radiograph dated 03/09/2011

CLINICAL DATA: Three-month history of cervical spine pain and
bilateral arm pain with numbness in both hands.

EXAM:
MRI CERVICAL SPINE WITHOUT CONTRAST
TECHNIQUE: Multiplanar, multisequence MR imaging of the cervical spine was
performed. No intravenous contrast was administered.

[Series 2: T2 · sagittal · 3.0mm · 0.70mm/px · 7 of 13 slices shown (1 of 2)]
[im 1/13]
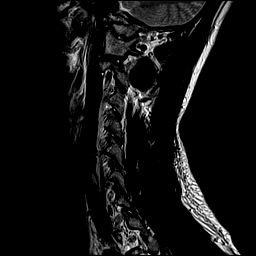
[im 3/13]
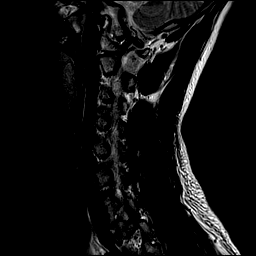
[im 5/13]
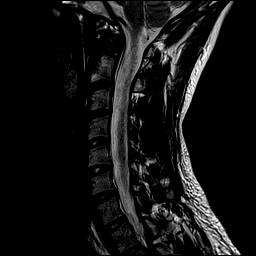
[im 7/13]
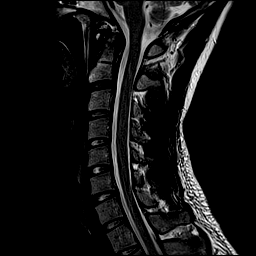
[im 9/13]
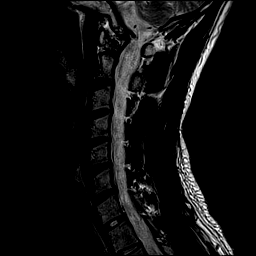
[im 11/13]
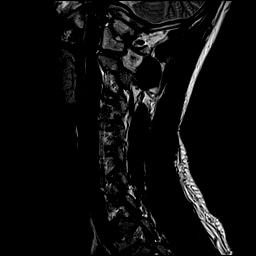
[im 13/13]
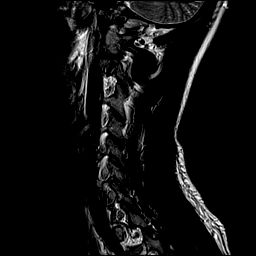

[Series 3: T1 · sagittal · 3.0mm · 0.70mm/px · 6 of 13 slices shown]
[im 1/13]
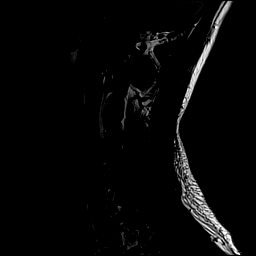
[im 3/13]
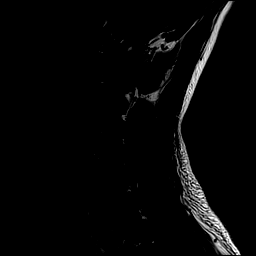
[im 5/13]
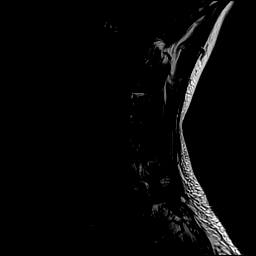
[im 8/13]
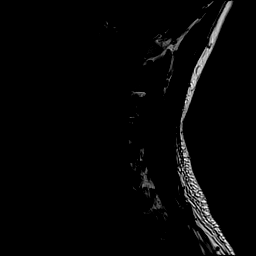
[im 10/13]
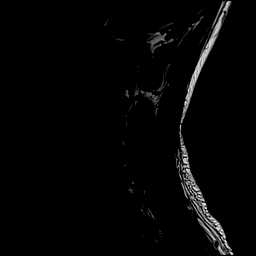
[im 13/13]
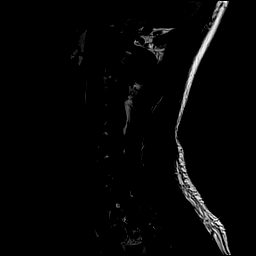

[Series 4: STIR · sagittal · 3.0mm · 0.35mm/px · 7 of 15 slices shown]
[im 1/15]
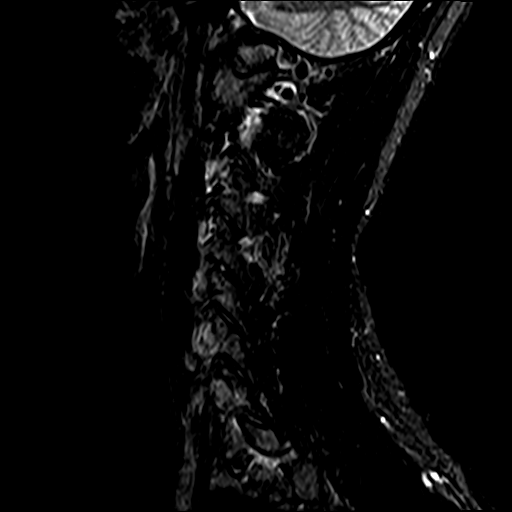
[im 3/15]
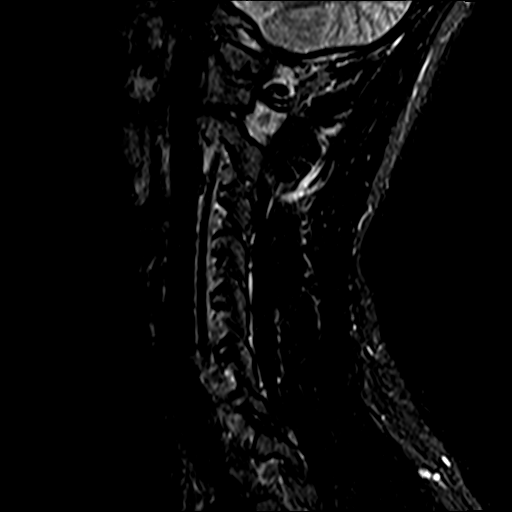
[im 5/15]
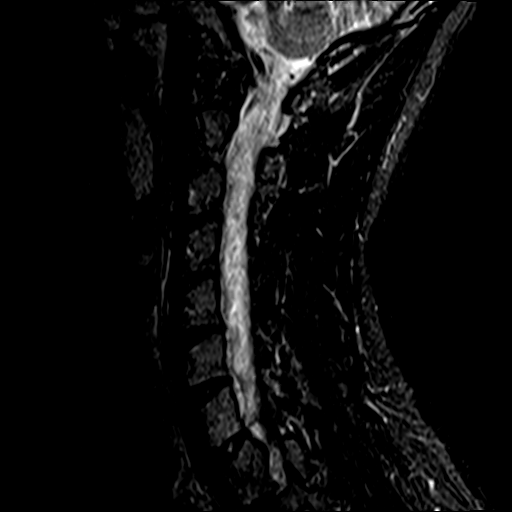
[im 8/15]
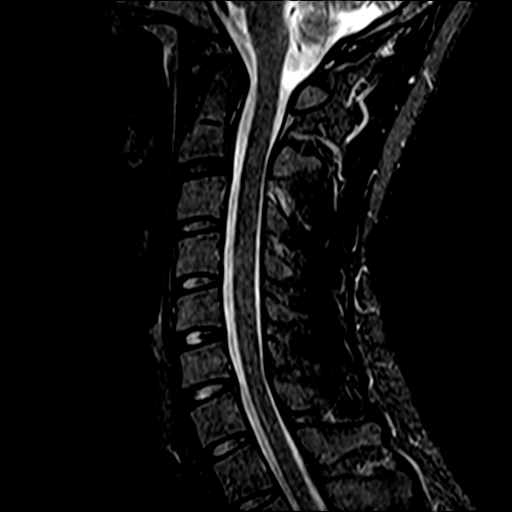
[im 10/15]
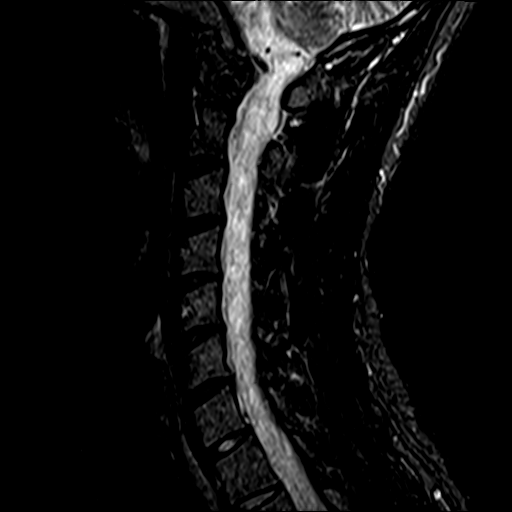
[im 12/15]
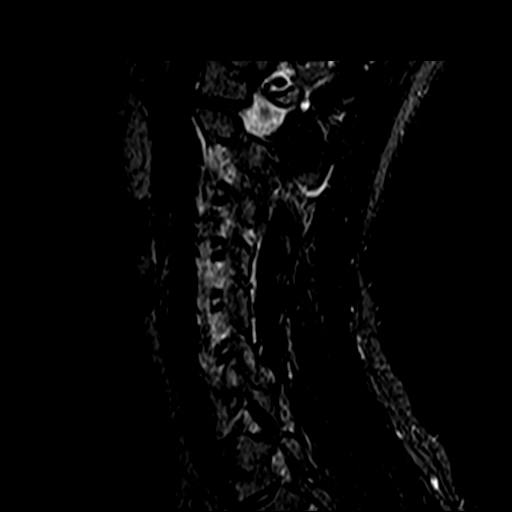
[im 15/15]
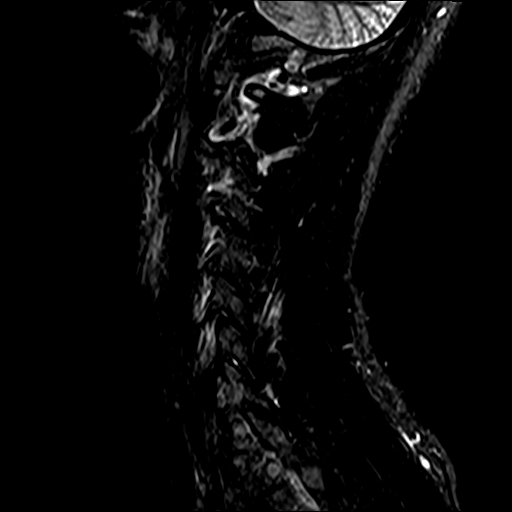

[Series 5: T2 · axial · 3.0mm · 0.70mm/px · z∈[-64,+49]mm · 8 of 30 slices shown (2 of 2)]
[im 1/30]
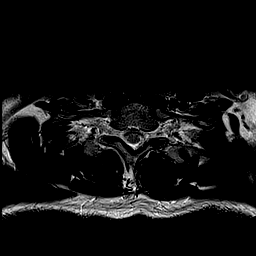
[im 5/30]
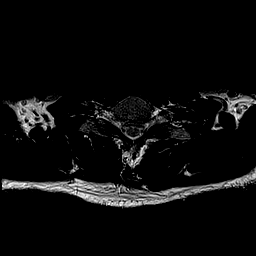
[im 9/30]
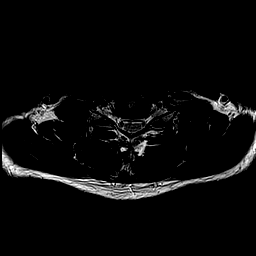
[im 14/30]
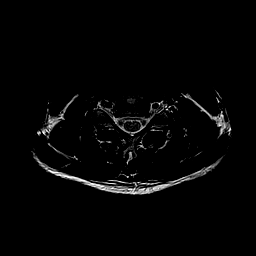
[im 16/30]
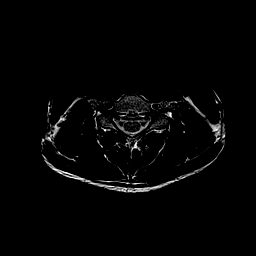
[im 21/30]
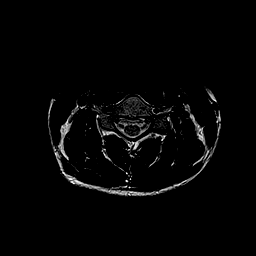
[im 25/30]
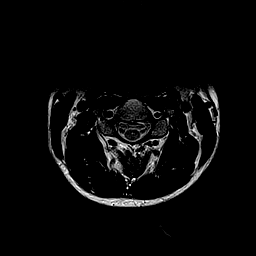
[im 30/30]
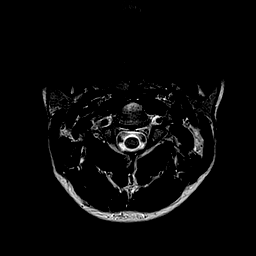

[Series 6: mpgr ax · axial · 3.0mm · 0.35mm/px · z∈[-64,+14]mm · 6 of 30 slices shown]
[im 1/30]
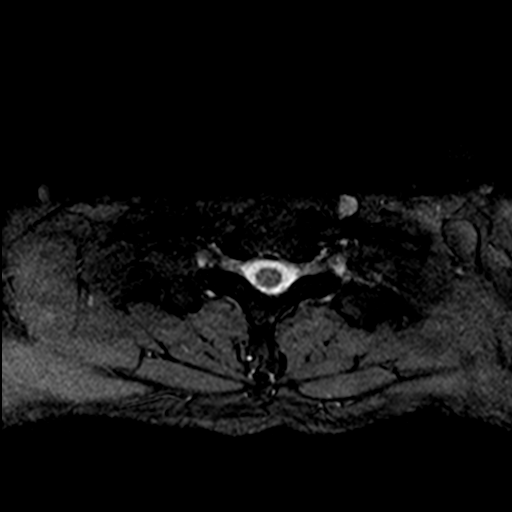
[im 5/30]
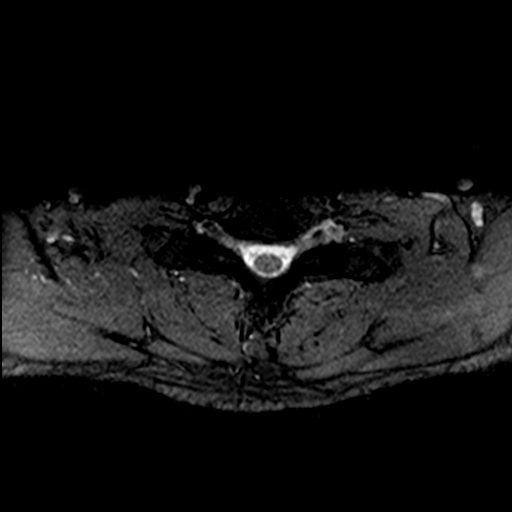
[im 9/30]
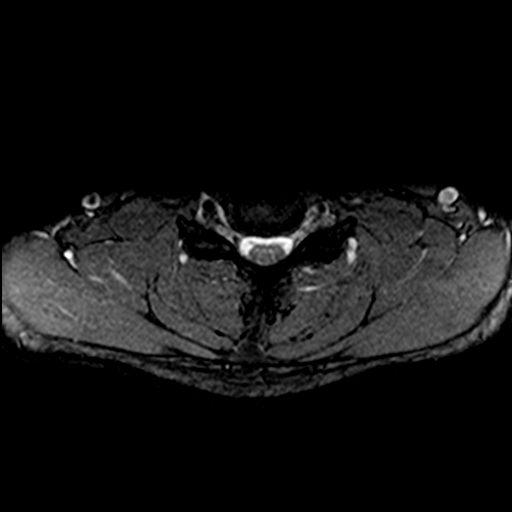
[im 14/30]
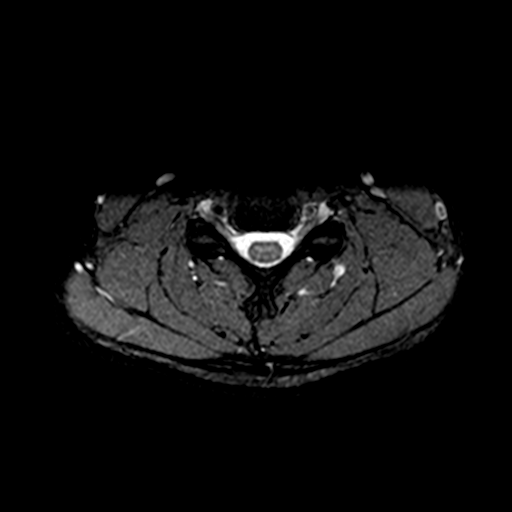
[im 16/30]
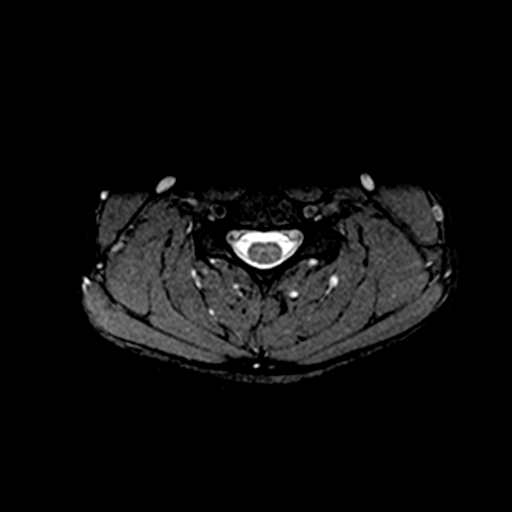
[im 21/30]
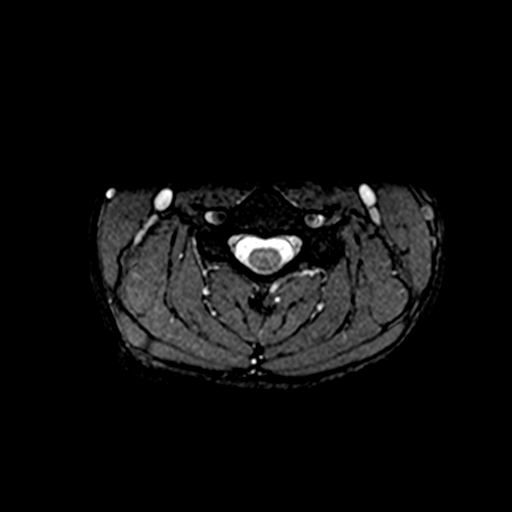

[34 of 48 positions shown; findings below may reference images not displayed]

FINDINGS: Alignment: Normal.

Vertebrae: Normal.

Cord: Normal.

Posterior Fossa, vertebral arteries, paraspinal tissues: Normal.

Disc levels:

Craniocervical junction through C2-3: 1 mm anterolisthesis of C2 on
C3 with slight desiccation of the disc. Otherwise normal.

C3-4: Minimal desiccation of the disc.  Otherwise normal.

C4-5 through C7-T1:  Normal.

No spinal or foraminal stenosis or facet arthritis.
IMPRESSION: Minimal degenerative disc disease at C2-3 and C3-4. Otherwise,
normal exam.

## 2017-08-08 LAB — GASTROINTESTINAL PATHOGEN PANEL PCR
C. difficile Tox A/B, PCR: DETECTED — AB
CAMPYLOBACTER, PCR: NOT DETECTED
CRYPTOSPORIDIUM, PCR: DETECTED — AB
E coli (ETEC) LT/ST PCR: NOT DETECTED
E coli (STEC) stx1/stx2, PCR: NOT DETECTED
E coli 0157, PCR: NOT DETECTED
GIARDIA LAMBLIA, PCR: NOT DETECTED
NOROVIRUS, PCR: NOT DETECTED
ROTAVIRUS, PCR: NOT DETECTED
Salmonella, PCR: DETECTED — AB
Shigella, PCR: DETECTED — AB

## 2017-08-09 ENCOUNTER — Telehealth: Payer: Self-pay | Admitting: Family Medicine

## 2017-08-09 NOTE — Telephone Encounter (Signed)
Copied from CRM [id number]. Topic: General - Other >> Aug 09, 2017 11:03 AM Raquel SarnaHayes, Teresa G wrote: Pt stopped taking the Rx-Anxiety medication per Dr. Sharen HonesGutierrez.  Pt is asking if he can start back taking the anxiety Rx now that the parasite infection has been cleared up.   Please call pt to discuss care.

## 2017-08-09 NOTE — Telephone Encounter (Signed)
Copied from CRM [phone]. Topic: General >> Aug 09, 2017 11:03 AM Raquel SarnaHayes, Teresa G wrote: Pt stopped taking the Rx-Anxiety medication per Dr. Sharen HonesGutierrez.  Pt is asking if he can start back taking the anxiety Rx now that the parasite infection has been cleared up.   Please call pt to discuss care.

## 2017-08-09 NOTE — Telephone Encounter (Signed)
Please advise 

## 2017-08-09 NOTE — Telephone Encounter (Signed)
Spoke with patient advised ok to restart effexor XR 37.5mg  daily. Touched base with patient re recent diarrheal eval - GI pathogen panel returned positive for cryptosporidium at ER. Saw ID Friday, just started antiparastic treatment today (nitazoxanide).  He did take cipro/flagyl x 1-2 days prior to second GI pathogen panel at ER. I had started this empirically due to ongoing diarrhea. Initial GI pathogen panel returned positive for C diff, Salmonella, Shigella, and Crytposporidium (just resulted last night).   Questions for ID - is first GI panel significant and should we finish empiric treatment for other infections?  I have placed a call in to ID to discuss.  Will fax to below #. Fax [phone] Attn Dr Sampson GoonFitzgerald

## 2017-08-10 NOTE — Telephone Encounter (Signed)
Spoke with ID Dr Sampson GoonFitzgerald. Not likely to be true positives - rec treat for cryptosporidium and monitor improvement. Misty StanleyLisa - please call patient, rec only take current antiparasitic he's taking, rec schedule f/u appt in 2-3 weeks to ensure diarrhea improving, also f/u anxiety.

## 2017-08-11 NOTE — Telephone Encounter (Addendum)
ok to restart effexor XR 37.5mg  daily. Also plz schedule 2 wk f/u visit.

## 2017-08-11 NOTE — Telephone Encounter (Signed)
Spoke with pt relaying instructions per Dr. Reece AgarG.  Pt is asking should he take the anxiety med too.  Gives permission to lvm if no answer when calling back with Dr. Timoteo ExposeG's response.

## 2017-08-11 NOTE — Telephone Encounter (Addendum)
Spoke with pt relaying message per Dr. Reece AgarG.  Pt says ok but he'll need to call back to schedule 2 wk f/u.  FYI to Dr. Reece AgarG.

## 2017-08-14 ENCOUNTER — Other Ambulatory Visit: Payer: Self-pay | Admitting: Family Medicine

## 2017-08-17 ENCOUNTER — Other Ambulatory Visit: Payer: Self-pay | Admitting: Family Medicine

## 2017-08-17 NOTE — Telephone Encounter (Signed)
Last filled:  07/29/17, #20 Last OV:   08/02/17 Next OV:  None  Was not sure if I refill this rx or not.

## 2017-08-18 ENCOUNTER — Other Ambulatory Visit: Payer: Self-pay | Admitting: Family Medicine

## 2017-08-18 NOTE — Telephone Encounter (Signed)
Last filled 08/02/17, #20 Last OV:  08/02/17 Next OV:  none

## 2017-08-18 NOTE — Telephone Encounter (Signed)
Copied from CRM [phone]. Topic: Quick Communication - Rx Refill/Question >> Aug 18, 2017 10:41 AM Viviann SpareWhite, Selina wrote: Medication: hydrOXYzine (ATARAX/VISTARIL) 25 MG tablet   Has the patient contacted their pharmacy? Yes.     (Agent: If no, request that the patient contact the pharmacy for the refill.)   Preferred Pharmacy (with phone number or street name):  [id number] [facility] , [place] - [address] [place] [id number] Phone: [phone] Fax: [phone]     Agent: Please be advised that RX refills may take up to 3 business days. We ask that you follow-up with your pharmacy.

## 2017-08-30 ENCOUNTER — Encounter: Payer: Self-pay | Admitting: Family Medicine

## 2017-08-30 ENCOUNTER — Ambulatory Visit: Payer: BLUE CROSS/BLUE SHIELD | Admitting: Family Medicine

## 2017-08-30 VITALS — BP 122/60 | HR 88 | Temp 99.5°F | Wt 163.5 lb

## 2017-08-30 DIAGNOSIS — J019 Acute sinusitis, unspecified: Secondary | ICD-10-CM

## 2017-08-30 DIAGNOSIS — A072 Cryptosporidiosis: Secondary | ICD-10-CM | POA: Diagnosis not present

## 2017-08-30 DIAGNOSIS — F411 Generalized anxiety disorder: Secondary | ICD-10-CM

## 2017-08-30 NOTE — Assessment & Plan Note (Signed)
Anticipate viral given short duration. Supportive care reviewed as per instructions - nasal saline, flonase, mucinex, tylenol or ibuprofen. Reviewed risks of abx therapy.  In h/o sinus surgery, low threshold to start abx if needed.

## 2017-08-30 NOTE — Assessment & Plan Note (Signed)
Just completed treatment for this, overall improved.  Advised to monitor GI sxs and let us know if any recurrence, low threshold to rpt GI pathogen panel eval for ongoing infection. Pt agrees with plan.

## 2017-08-30 NOTE — Progress Notes (Signed)
BP 122/60 (BP Location: Left Arm, Patient Position: Sitting, Cuff Size: Normal)   Pulse 88   Temp 99.5 F (37.5 C) (Oral)   Wt 163 lb 8 oz (74.2 kg)   SpO2 96%   BMI 22.17 kg/m    CC: ER f/u visit Subjective:    Patient ID: Brian Spence, male    DOB: 11/11/1990, 27 y.o.   MRN: [id number]  HPI: Brian Balimothy S Oviedo is a 27 y.o. male presenting on 08/30/2017 for Hospitalization Follow-up (Ad[facility], dx cryptosporidial gastroenteritis.) and URI (Nasal congestion, nasal drainage, chills, body aches and cough. Sxs started 08/27/17. Tried Mucinex, ibuprofen and Hauls cough drops. )   Recent ER eval then ID eval for cryptosporidial gastroenteritis. Initial GI pathogen panel also positive for C diff, salmonella, shigella - likely false positive after discussion with ID. He was treated with nitazoxanide antiparasitic - completed and feeling better. Intermittent abd discomfort, intermittent diarrhea but he is also having normal stools.   4d h/o facial sinus pain, headache, productive cough of brown/green sputum, ST, chills, body aches.  No fevers, ear or tooth pain.  He does think he got flu shot this year but unsure.  GF sick at home.  Taking sinus rinses, ibuprofen, mucinex and halls cough drops and throat spray.   Anxiety - last visit we started effexor XR 37.5mg  daily. Feels this may be helping.   Relevant past medical, surgical, family and social history reviewed and updated as indicated. Interim medical history since our last visit reviewed. Allergies and medications reviewed and updated. Outpatient Medications Prior to Visit  Medication Sig Dispense Refill  . albuterol (PROVENTIL HFA;VENTOLIN HFA) 108 (90 Base) MCG/ACT inhaler Inhale 2 puffs into the lungs every 6 (six) hours as needed for wheezing. 1 Inhaler 2  . budesonide-formoterol (SYMBICORT) 80-4.5 MCG/ACT inhaler Inhale 2 puffs into the lungs 2 (two) times daily. 1 Inhaler 2  . fluticasone (FLONASE) 50 MCG/ACT  nasal spray Place 2 sprays into both nostrils daily. (Patient taking differently: Place 2 sprays into both nostrils daily as needed for allergies. ) 16 g 3  . hydrOXYzine (ATARAX/VISTARIL) 25 MG tablet TAKE 0.5-1 TABS BY MOUTH 2 TIMES DAILY AS NEEDED FOR ANXIETY **SEDATION PRECAUTIONS** 30 tablet 0  . ondansetron (ZOFRAN-ODT) 4 MG disintegrating tablet TAKE 1 TABLET BY MOUTH EVERY 8 HOURS AS NEEDED FOR NAUSEA AND VOMITING 20 tablet 0  . venlafaxine XR (EFFEXOR XR) 37.5 MG 24 hr capsule Take 1 capsule (37.5 mg total) by mouth daily with breakfast. 30 capsule 6  . ciprofloxacin (CIPRO) 500 MG tablet Take 1 tablet (500 mg total) by mouth 2 (two) times daily. 6 tablet 0  . metroNIDAZOLE (FLAGYL) 500 MG tablet Take 1 tablet (500 mg total) by mouth 3 (three) times daily. 30 tablet 0   No facility-administered medications prior to visit.      Per HPI unless specifically indicated in ROS section below Review of Systems     Objective:    BP 122/60 (BP Location: Left Arm, Patient Position: Sitting, Cuff Size: Normal)   Pulse 88   Temp 99.5 F (37.5 C) (Oral)   Wt 163 lb 8 oz (74.2 kg)   SpO2 96%   BMI 22.17 kg/m   Wt Readings from Last 3 Encounters:  08/30/17 163 lb 8 oz (74.2 kg)  08/04/17 165 lb (74.8 kg)  08/02/17 165 lb (74.8 kg)    Physical Exam  Constitutional: He appears well-developed and well-nourished. No distress.  HENT:  Head: Normocephalic and atraumatic.  Right Ear: Hearing, tympanic membrane, external ear and ear canal normal.  Left Ear: Hearing, tympanic membrane, external ear and ear canal normal.  Nose: Mucosal edema present. No rhinorrhea. Right sinus exhibits maxillary sinus tenderness. Right sinus exhibits no frontal sinus tenderness. Left sinus exhibits maxillary sinus tenderness. Left sinus exhibits no frontal sinus tenderness.  Mouth/Throat: Uvula is midline, oropharynx is clear and moist and mucous membranes are normal. No oropharyngeal exudate, posterior  oropharyngeal edema, posterior oropharyngeal erythema or tonsillar abscesses.  Eyes: Conjunctivae and EOM are normal. Pupils are equal, round, and reactive to light. No scleral icterus.  Neck: Normal range of motion. Neck supple.  Cardiovascular: Normal rate, regular rhythm, normal heart sounds and intact distal pulses.  No murmur heard. Pulmonary/Chest: Effort normal and breath sounds normal. No respiratory distress. He has no wheezes. He has no rales.  Abdominal: Soft. Normal appearance and bowel sounds are normal. He exhibits no distension and no mass. There is no hepatosplenomegaly. There is tenderness (mild) in the periumbilical area and suprapubic area. There is no rigidity, no rebound, no guarding, no CVA tenderness and negative Murphy's sign.  Lymphadenopathy:    He has no cervical adenopathy.  Skin: Skin is warm and dry. No rash noted.  Nursing note and vitals reviewed.  Results for orders placed or performed during the hospital encounter of 08/04/17  C difficile quick scan w PCR reflex  Result Value Ref Range   C Diff antigen NEGATIVE NEGATIVE   C Diff toxin NEGATIVE NEGATIVE   C Diff interpretation No C. difficile detected.   Gastrointestinal Panel by PCR , Stool  Result Value Ref Range   Campylobacter species NOT DETECTED NOT DETECTED   Plesimonas shigelloides NOT DETECTED NOT DETECTED   Salmonella species NOT DETECTED NOT DETECTED   Yersinia enterocolitica NOT DETECTED NOT DETECTED   Vibrio species NOT DETECTED NOT DETECTED   Vibrio cholerae NOT DETECTED NOT DETECTED   Enteroaggregative E coli (EAEC) NOT DETECTED NOT DETECTED   Enteropathogenic E coli (EPEC) NOT DETECTED NOT DETECTED   Enterotoxigenic E coli (ETEC) NOT DETECTED NOT DETECTED   Shiga like toxin producing E coli (STEC) NOT DETECTED NOT DETECTED   Shigella/Enteroinvasive E coli (EIEC) NOT DETECTED NOT DETECTED   Cryptosporidium DETECTED (A) NOT DETECTED   Cyclospora cayetanensis NOT DETECTED NOT DETECTED    Entamoeba histolytica NOT DETECTED NOT DETECTED   Giardia lamblia NOT DETECTED NOT DETECTED   Adenovirus F40/41 NOT DETECTED NOT DETECTED   Astrovirus NOT DETECTED NOT DETECTED   Norovirus GI/GII NOT DETECTED NOT DETECTED   Rotavirus A NOT DETECTED NOT DETECTED   Sapovirus (I, II, IV, and V) NOT DETECTED NOT DETECTED  Comprehensive metabolic panel  Result Value Ref Range   Sodium 136 135 - 145 mmol/L   Potassium 3.9 3.5 - 5.1 mmol/L   Chloride 103 101 - 111 mmol/L   CO2 25 22 - 32 mmol/L   Glucose, Bld 82 65 - 99 mg/dL   BUN 21 (H) 6 - 20 mg/dL   Creatinine, Ser 1.61 0.61 - 1.24 mg/dL   Calcium 8.7 (L) 8.9 - 10.3 mg/dL   Total Protein 7.6 6.5 - 8.1 g/dL   Albumin 4.5 3.5 - 5.0 g/dL   AST 28 15 - 41 U/L   ALT 31 17 - 63 U/L   Alkaline Phosphatase 76 38 - 126 U/L   Total Bilirubin 0.4 0.3 - 1.2 mg/dL   GFR calc non Af Amer >60 >60 mL/min  GFR calc Af Amer >60 >60 mL/min   Anion gap 8 5 - 15  CBC  Result Value Ref Range   WBC 8.8 3.8 - 10.6 K/uL   RBC 5.07 4.40 - 5.90 MIL/uL   Hemoglobin 15.1 13.0 - 18.0 g/dL   HCT 16.1 09.6 - 04.5 %   MCV 88.1 80.0 - 100.0 fL   MCH 29.7 26.0 - 34.0 pg   MCHC 33.7 32.0 - 36.0 g/dL   RDW 40.9 81.1 - 91.4 %   Platelets 318 150 - 440 K/uL  Occult blood card to lab, stool  Result Value Ref Range   Fecal Occult Bld NEGATIVE NEGATIVE  Rapid HIV screen (HIV 1/2 Ab+Ag)  Result Value Ref Range   HIV-1 P24 Antigen - HIV24 NON REACTIVE NON REACTIVE   HIV 1/2 Antibodies NON REACTIVE NON REACTIVE   Interpretation (HIV Ag Ab)      A non reactive test result means that HIV 1 or HIV 2 antibodies and HIV 1 p24 antigen were not detected in the specimen.  Differential  Result Value Ref Range   Neutrophils Relative % 58 %   Neutro Abs 5.1 1.4 - 6.5 K/uL   Lymphocytes Relative 30 %   Lymphs Abs 2.7 1.0 - 3.6 K/uL   Monocytes Relative 9 %   Monocytes Absolute 0.8 0.2 - 1.0 K/uL   Eosinophils Relative 2 %   Eosinophils Absolute 0.1 0 - 0.7 K/uL    Basophils Relative 1 %   Basophils Absolute 0.1 0 - 0.1 K/uL  Type and screen [facility]  Result Value Ref Range   ABO/RH(D) A POS    Antibody Screen NEG    Sample Expiration      08/07/2017 Performed at [facility], [address] [place], [place] [address]       Assessment & Plan:   Problem List Items Addressed This Visit    Acute sinusitis - Primary    Anticipate viral given short duration. Supportive care reviewed as per instructions - nasal saline, flonase, mucinex, tylenol or ibuprofen. Reviewed risks of abx therapy.  In h/o sinus surgery, low threshold to start abx if needed.       Cryptosporidial gastroenteritis (HCC)    Just completed treatment for this, overall improved.  Advised to monitor GI sxs and let us know if any recurrence, low threshold to rpt GI pathogen panel eval for ongoing infection. Pt agrees with plan.       Generalized anxiety disorder    Feels effexor may be beneficial, but has only been on this regularly for the past 2 weeks. Continue, update [place] with effect in 2-3 wks, would consider increased dose at that time.           Follow up plan: Return if symptoms worsen or fail to improve.  [name], MD

## 2017-08-30 NOTE — Patient Instructions (Addendum)
I think you have a sinus infection, likely viral. Push fluids and plenty of rest. Take tylenol for fascial pain, if not helpful then may take ibuprofen 40-600mg  with meals.  Nasal saline irrigation or neti pot to help drain sinuses. Start flonase for sinus inflammation.  May use plain mucinex with plenty of fluid to help mobilize mucous. If fever >101, worsening productive cough or ongoing sinus symptoms past 10 days, let us know.  For recent parasitic diarrhea - seems to be improving.  Continue effexor XR 37.5mg  daily and update us with effect in 2-3 weeks.

## 2017-08-30 NOTE — Assessment & Plan Note (Signed)
Feels effexor may be beneficial, but has only been on this regularly for the past 2 weeks. Continue, update us with effect in 2-3 wks, would consider increased dose at that time.

## 2017-09-02 ENCOUNTER — Other Ambulatory Visit: Payer: Self-pay | Admitting: Family Medicine

## 2017-09-05 ENCOUNTER — Telehealth: Payer: Self-pay | Admitting: *Deleted

## 2017-09-05 DIAGNOSIS — A09 Infectious gastroenteritis and colitis, unspecified: Secondary | ICD-10-CM

## 2017-09-05 NOTE — Telephone Encounter (Signed)
Copied from CRM [id number]. Topic: General - Other >> Sep 05, 2017  9:54 AM Oneal GroutSebastian, Jennifer S wrote: Reason for CRM: Patients symptoms have not improved, cough is worse, voice going in and out. Would like a call back. Patient was seen on 08/30/17

## 2017-09-05 NOTE — Telephone Encounter (Signed)
Spoke to patient and was advised that he has a productive cough-brownish/green/yellow and not sure about a fever because he has not checked his temperature. Patient stated that he has head congestion, facial pain and headache. Patient stated that he is still having some SOB which he discussed at the office visit last week. Patient stated that he is still having diarrhea.

## 2017-09-06 MED ORDER — AMOXICILLIN-POT CLAVULANATE 875-125 MG PO TABS: 1.0000 | ORAL_TABLET | Freq: Two times a day (BID) | ORAL | 0 refills | Status: AC

## 2017-09-06 NOTE — Telephone Encounter (Signed)
plz get details on diarrhea - how often, what consistency, any blood? As far as respiratory symptoms, go ahead and start augmentin antibiotic sent to pharmacy.

## 2017-09-06 NOTE — Telephone Encounter (Signed)
plz have him come in for repeat stool study come in as soon as possible, and if abnormal may refer back to ID.  I have ordered this

## 2017-09-06 NOTE — Telephone Encounter (Addendum)
Patient notified as instructed by telephone and verbalized understanding. Patient stated that he started with diarrhea after starting Theraflu on Tuesday, but did stop the Theraflu Thursday. Patient stated that it has been constant diarrhea Sunday and Monday. Patient stated that the diarrhea is liquid and smells awful. Patient stated that he has started back with stomach pain. Patient stated that he has not seen any blood in the diarrhea. Patient stated that his girlfriend has bronchitis.

## 2017-09-06 NOTE — Telephone Encounter (Signed)
Spoke with pt relaying message per Dr. Reece AgarG.  Pt verbalizes understanding and says he will pick up test first thing tomorrow morning.

## 2017-09-08 ENCOUNTER — Other Ambulatory Visit: Payer: BLUE CROSS/BLUE SHIELD

## 2017-09-08 DIAGNOSIS — A09 Infectious gastroenteritis and colitis, unspecified: Secondary | ICD-10-CM

## 2017-09-12 LAB — GASTROINTESTINAL PATHOGEN PANEL PCR
C. difficile Tox A/B, PCR: NOT DETECTED
CAMPYLOBACTER, PCR: NOT DETECTED
Cryptosporidium, PCR: NOT DETECTED
E coli (ETEC) LT/ST PCR: NOT DETECTED
E coli (STEC) stx1/stx2, PCR: NOT DETECTED
E coli 0157, PCR: NOT DETECTED
GIARDIA LAMBLIA, PCR: NOT DETECTED
NOROVIRUS, PCR: NOT DETECTED
ROTAVIRUS, PCR: NOT DETECTED
SALMONELLA, PCR: NOT DETECTED
SHIGELLA, PCR: NOT DETECTED

## 2017-09-13 ENCOUNTER — Telehealth: Payer: Self-pay | Admitting: Family Medicine

## 2017-09-13 NOTE — Telephone Encounter (Signed)
Pt given lab results. [See labs, 09/08/17.]

## 2017-09-13 NOTE — Telephone Encounter (Signed)
Copied from CRM [id number]. Topic: Quick Communication - Lab Results >> Sep 13, 2017 12:03 PM Thelia Tanksley, Tresa EndoKelly, [place] wrote: Patient calling because he would like to have his lab results. If someone could give him a call back about this at [phone]

## 2017-09-18 ENCOUNTER — Other Ambulatory Visit: Payer: Self-pay | Admitting: Family Medicine

## 2017-09-19 NOTE — Telephone Encounter (Signed)
Last filled 09/05/17, #30 Last OV:  08/30/17 Next OV:  None

## 2017-09-19 NOTE — Telephone Encounter (Signed)
Left message on vm asking pt to call back.   Need to confirm how pt is taking med.

## 2017-09-19 NOTE — Telephone Encounter (Signed)
Will refill at this time. appt scheduled for Monday.

## 2017-09-19 NOTE — Telephone Encounter (Signed)
Patient returned call, I asked how much of the hydroxyzine for anxiety is he taking, he says "I take 1 tab twice a day, sometimes I take it three times depending on my anxiety level. Can you go ahead and make me a follow up appointment for my anxiety?" Appointment was made for Monday with Dr. Sharen HonesGutierrez, I advised this medication instruction would be sent over for approval, he verbalized understanding.

## 2017-09-26 ENCOUNTER — Ambulatory Visit: Payer: BLUE CROSS/BLUE SHIELD | Admitting: Family Medicine

## 2017-09-26 ENCOUNTER — Encounter: Payer: Self-pay | Admitting: Family Medicine

## 2017-09-26 VITALS — BP 122/64 | HR 63 | Temp 97.9°F | Wt 168.0 lb

## 2017-09-26 DIAGNOSIS — R2232 Localized swelling, mass and lump, left upper limb: Secondary | ICD-10-CM | POA: Diagnosis not present

## 2017-09-26 DIAGNOSIS — R197 Diarrhea, unspecified: Secondary | ICD-10-CM | POA: Diagnosis not present

## 2017-09-26 DIAGNOSIS — F411 Generalized anxiety disorder: Secondary | ICD-10-CM | POA: Diagnosis not present

## 2017-09-26 MED ORDER — VENLAFAXINE HCL ER 75 MG PO CP24
75.0000 mg | ORAL_CAPSULE | Freq: Every day | ORAL | 6 refills | Status: DC
Start: 2017-09-26 — End: 2018-04-13

## 2017-09-26 NOTE — Assessment & Plan Note (Addendum)
Small lipoma vs epidermal cyst - likely benign. Recommended against treatment. Will monitor for now.

## 2017-09-26 NOTE — Assessment & Plan Note (Signed)
Chronic, not controlled. Will increase effexor XR to 75mg  daily. Reviewed hydroxyzine dosing. Discussed work stress and relation to mood. Pt currently undergoing job search for new job.  PHQ9 = 20 GAD7 = 19

## 2017-09-26 NOTE — Progress Notes (Signed)
BP 122/64 (BP Location: Left Arm, Patient Position: Sitting, Cuff Size: Normal)   Pulse 63   Temp 97.9 F (36.6 C) (Oral)   Wt 168 lb (76.2 kg)   SpO2 99%   BMI 22.78 kg/m    CC: anxiety f/u Subjective:    Patient ID: Brian Spence, male    DOB: 1990-12-20, 27 y.o.   MRN: [id number]  HPI: Brian Spence is a 27 y.o. male presenting on 09/26/2017 for Anxiety (Here for follow-up.) and Cyst (Located on posterior left shoulder. Has had about 1 yr. Area is occasionally painful, red and itchy. Has increased in size. )   Anxiety - last visit we started effexor XR 37.5mg  daily. Finding increasing depression. Increased stress at work, increased responsibilities. Denies SI/HI. Hydroxyzine 1 tablet somewhat helpful but he notes some sedation. Takes medication at work. Averaging 70 hour work week. Trouble sleeping due to stress at work.   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 Initial improvement in diarrhea, but then it recurred, associated with lower abdominal discomfort, no blood in stool. GI pathogen panel was repeated (09/08/2017) - all negative. He felt better when augmentin course was completed (treated with augmentin for sinusitis).   Also - wants lump on left shoulder checked - may be growing  Relevant past medical, surgical, family and social history reviewed and updated as indicated. Interim medical history since our last visit reviewed. Allergies and medications reviewed and updated. Outpatient Medications Prior to Visit  Medication Sig Dispense Refill  . albuterol (PROVENTIL HFA;VENTOLIN HFA) 108 (90 Base) MCG/ACT inhaler Inhale 2 puffs into the lungs every 6 (six) hours as needed for wheezing. 1 Inhaler 2  . budesonide-formoterol (SYMBICORT) 80-4.5 MCG/ACT inhaler Inhale 2 puffs into the lungs 2 (two) times daily. 1 Inhaler 2  . fluticasone (FLONASE) 50 MCG/ACT nasal spray Place 2 sprays  into both nostrils daily. (Patient taking differently: Place 2 sprays into both nostrils daily as needed for allergies. ) 16 g 3  . hydrOXYzine (ATARAX/VISTARIL) 25 MG tablet TAKE 0.5-1 TABS BY MOUTH 2 TIMES DAILY AS NEEDED FOR ANXIETY **SEDATION PRECAUTIONS** 40 tablet 0  . ondansetron (ZOFRAN-ODT) 4 MG disintegrating tablet TAKE 1 TABLET BY MOUTH EVERY 8 HOURS AS NEEDED FOR NAUSEA AND VOMITING 20 tablet 0  . venlafaxine XR (EFFEXOR XR) 37.5 MG 24 hr capsule Take 1 capsule (37.5 mg total) by mouth daily with breakfast. 30 capsule 6   No facility-administered medications prior to visit.      Per HPI unless specifically indicated in ROS section below Review of Systems     Objective:    BP 122/64 (BP Location: Left Arm, Patient Position: Sitting, Cuff Size: Normal)   Pulse 63   Temp 97.9 F (36.6 C) (Oral)   Wt 168 lb (76.2 kg)   SpO2 99%   BMI 22.78 kg/m   Wt Readings from Last 3 Encounters:  09/26/17 168 lb (76.2 kg)  08/30/17 163 lb 8 oz (74.2 kg)  08/04/17 165 lb (74.8 kg)    Physical Exam  Constitutional: He appears well-developed and well-nourished. No distress.  Skin: Skin is warm and dry. No erythema.  Small lump L lateral/posterior shoulder, subcm size, no erythema or tenderness, well circumscribed  Psychiatric: He has a normal mood and affect.  Nursing note and vitals reviewed.  Results for orders placed or performed in visit on 09/08/17  Gastrointestinal Pathogen Panel PCR  Result Value Ref Range   Campylobacter, PCR  NOT DETECTED NOT DETECT   C. difficile Tox A/B, PCR NOT DETECTED NOT DETECT   E coli 0157, PCR NOT DETECTED NOT DETECT   E coli (ETEC) LT/ST PCR NOT DETECTED NOT DETECT   E coli (STEC) stx1/stx2, PCR NOT DETECTED NOT DETECT   Salmonella, PCR NOT DETECTED NOT DETECT   Shigella, PCR NOT DETECTED NOT DETECT   Norovirus, PCR NOT DETECTED NOT DETECT   Rotavirus A, PCR NOT DETECTED NOT DETECT   Giardia lamblia, PCR NOT DETECTED NOT DETECT    Cryptosporidium, PCR NOT DETECTED NOT DETECT      Assessment & Plan:   Problem List Items Addressed This Visit    Diarrhea    Initial infectious diarrhea has now resolved. Second episode of diarrhea likely related to augmentin antibiotic - as it improved once he finished course. Discussed yogurt, probiotic use.       Generalized anxiety disorder - Primary    Chronic, not controlled. Will increase effexor XR to 75mg  daily. Reviewed hydroxyzine dosing. Discussed work stress and relation to mood. Pt currently undergoing job search for new job.  PHQ9 = 20 GAD7 = 19      Relevant Medications   venlafaxine XR (EFFEXOR-XR) 75 MG 24 hr capsule   Skin lump of arm, left    Small lipoma vs epidermal cyst - likely benign. Recommended against treatment. Will monitor for now.           Meds ordered this encounter  Medications  . venlafaxine XR (EFFEXOR-XR) 75 MG 24 hr capsule    Sig: Take 1 capsule (75 mg total) by mouth daily with breakfast.    Dispense:  30 capsule    Refill:  6   No orders of the defined types were placed in this encounter.   Follow up plan: Return in about 6 weeks (around 11/07/2017) for follow up visit.  Brian Boyden, MD

## 2017-09-26 NOTE — Assessment & Plan Note (Signed)
Initial infectious diarrhea has now resolved. Second episode of diarrhea likely related to augmentin antibiotic - as it improved once he finished course. Discussed yogurt, probiotic use.

## 2017-09-26 NOTE — Patient Instructions (Signed)
Increase effexor XR to 75mg  daily.  Update me with effect in 1 month, return in 6-8 weeks for follow up, sooner if needed.  Don't suddenly stop medicine.

## 2017-10-08 ENCOUNTER — Other Ambulatory Visit: Payer: Self-pay | Admitting: Family Medicine

## 2017-11-07 ENCOUNTER — Other Ambulatory Visit: Payer: Self-pay | Admitting: Family Medicine

## 2017-12-13 ENCOUNTER — Other Ambulatory Visit: Payer: Self-pay | Admitting: Family Medicine

## 2017-12-13 NOTE — Telephone Encounter (Signed)
Electronic refill request Last office visit 09/26/17 Last refill 11/07/17 #40

## 2018-04-13 ENCOUNTER — Telehealth: Payer: Self-pay | Admitting: Family Medicine

## 2018-04-13 MED ORDER — VENLAFAXINE HCL ER 75 MG PO CP24: 75.0000 mg | ORAL_CAPSULE | Freq: Every day | ORAL | 0 refills | Status: DC

## 2018-04-13 NOTE — Telephone Encounter (Signed)
Copied from CRM [id number]. Topic: Quick Communication - Rx Refill/Question >> Apr 13, 2018 10:00 AM Baldo DaubAlexander, Amber L wrote: Medication: venlafaxine XR (EFFEXOR-XR) 75 MG 24 hr capsule  Has the patient contacted their pharmacy? No - he called in for OV and made request at that time.  He will also call pharmacy.  Pt is out of pills (Agent: If no, request that the patient contact the pharmacy for the refill.) (Agent: If yes, when and what did the pharmacy advise?)  Preferred Pharmacy (with phone number or street name): [id number]- [place], [place] - [phone] (Phone) [phone] (Fax)  Agent: Please be advised that RX refills may take up to 3 business days. We ask that you follow-up with your pharmacy.

## 2018-04-17 ENCOUNTER — Ambulatory Visit: Payer: BLUE CROSS/BLUE SHIELD | Admitting: Family Medicine

## 2018-04-17 ENCOUNTER — Encounter: Payer: Self-pay | Admitting: Family Medicine

## 2018-04-17 VITALS — BP 120/64 | HR 63 | Temp 97.8°F | Ht 72.0 in | Wt 182.5 lb

## 2018-04-17 DIAGNOSIS — Z23 Encounter for immunization: Secondary | ICD-10-CM

## 2018-04-17 DIAGNOSIS — F411 Generalized anxiety disorder: Secondary | ICD-10-CM | POA: Diagnosis not present

## 2018-04-17 MED ORDER — VENLAFAXINE HCL ER 150 MG PO CP24: 150.0000 mg | ORAL_CAPSULE | Freq: Every day | ORAL | 3 refills | Status: DC

## 2018-04-17 NOTE — Progress Notes (Signed)
BP 120/64 (BP Location: Left Arm, Patient Position: Sitting, Cuff Size: Normal)   Pulse 63   Temp 97.8 F (36.6 C) (Oral)   Ht 6' (1.829 m)   Wt 182 lb 8 oz (82.8 kg)   SpO2 99%   BMI 24.75 kg/m    CC: 6 mo f/u Subjective:    Patient ID: Brian Spence, male    DOB: 1990-12-18, 27 y.o.   MRN: [id number]  HPI: Brian Spence is a 27 y.o. male presenting on 04/17/2018 for Anxiety (Here for f/u.)   GAD - current regimen is hydroxyzine 12.5-25mg  daily PRN (sparing use) and effexor XR 75mg  daily. Ran out of this medicine last week. While on it he did note improvement in anxiety and would like to restart this, requests higher dose. Continued high stress at work - increased job responsibilities.   Works at [employer].   Relevant past medical, surgical, family and social history reviewed and updated as indicated. Interim medical history since our last visit reviewed. Allergies and medications reviewed and updated. Outpatient Medications Prior to Visit  Medication Sig Dispense Refill  . albuterol (PROVENTIL HFA;VENTOLIN HFA) 108 (90 Base) MCG/ACT inhaler Inhale 2 puffs into the lungs every 6 (six) hours as needed for wheezing. 1 Inhaler 2  . budesonide-formoterol (SYMBICORT) 80-4.5 MCG/ACT inhaler Inhale 2 puffs into the lungs 2 (two) times daily. 1 Inhaler 2  . fluticasone (FLONASE) 50 MCG/ACT nasal spray Place 2 sprays into both nostrils daily. (Patient taking differently: Place 2 sprays into both nostrils daily as needed for allergies. ) 16 g 3  . hydrOXYzine (ATARAX/VISTARIL) 25 MG tablet TAKE 0.5-1 TABS BY MOUTH 2 TIMES DAILY AS NEEDED FOR ANXIETY **SEDATION PRECAUTIONS** 40 tablet 3  . venlafaxine XR (EFFEXOR-XR) 75 MG 24 hr capsule Take 1 capsule (75 mg total) by mouth daily with breakfast. 30 capsule 0  . ondansetron (ZOFRAN-ODT) 4 MG disintegrating tablet TAKE 1 TABLET BY MOUTH EVERY 8 HOURS AS NEEDED FOR NAUSEA AND VOMITING 20 tablet 0   No facility-administered medications  prior to visit.      Per HPI unless specifically indicated in ROS section below Review of Systems     Objective:    BP 120/64 (BP Location: Left Arm, Patient Position: Sitting, Cuff Size: Normal)   Pulse 63   Temp 97.8 F (36.6 C) (Oral)   Ht 6' (1.829 m)   Wt 182 lb 8 oz (82.8 kg)   SpO2 99%   BMI 24.75 kg/m   Wt Readings from Last 3 Encounters:  04/17/18 182 lb 8 oz (82.8 kg)  09/26/17 168 lb (76.2 kg)  08/30/17 163 lb 8 oz (74.2 kg)    Physical Exam  Constitutional: He appears well-developed and well-nourished. No distress.  HENT:  Mouth/Throat: Oropharynx is clear and moist. No oropharyngeal exudate.  Cardiovascular: Normal rate, regular rhythm and normal heart sounds.  No murmur heard. Pulmonary/Chest: Effort normal and breath sounds normal. No respiratory distress. He has no wheezes. He has no rales.  Musculoskeletal: He exhibits no edema.  Psychiatric: He has a normal mood and affect.  Nursing note and vitals reviewed.      Assessment & Plan:   Problem List Items Addressed This Visit    Generalized anxiety disorder - Primary    Overall doing well while on medication, but notes more trouble since he ran out. Interested in higher effexor dosing - will send 150mg  XR daily. Advised not to run out of medication. RTC 4-6 mo f/u visit.  Questionairre filled out off effexor the past week.  PQH9 = 18 GAD7 = 21      Relevant Medications   venlafaxine XR (EFFEXOR-XR) 150 MG 24 hr capsule    Other Visit Diagnoses    Need for influenza vaccination       Relevant Orders   Flu Vaccine QUAD 36+ mos IM (Completed)       Meds ordered this encounter  Medications  . venlafaxine XR (EFFEXOR-XR) 150 MG 24 hr capsule    Sig: Take 1 capsule (150 mg total) by mouth daily with breakfast.    Dispense:  90 capsule    Refill:  3   Orders Placed This Encounter  Procedures  . Flu Vaccine QUAD 36+ mos IM    Follow up plan: Return in about 4 months (around 08/17/2018) for  follow up visit.  [name], MD

## 2018-04-17 NOTE — Assessment & Plan Note (Signed)
Overall doing well while on medication, but notes more trouble since he ran out. Interested in higher effexor dosing - will send 150mg  XR daily. Advised not to run out of medication. RTC 4-6 mo f/u visit. Questionairre filled out off effexor the past week.  PQH9 = 18 GAD7 = 21

## 2018-04-17 NOTE — Patient Instructions (Signed)
Flu shot today Goal blood pressure <140/90 Restart effexor, new dose will be 150mg  daily. Important not to run out of medicine.

## 2018-05-08 DIAGNOSIS — M25562 Pain in left knee: Secondary | ICD-10-CM | POA: Insufficient documentation

## 2018-06-25 HISTORY — PX: KNEE ARTHROSCOPY W/ PLICA EXCISION: SHX1884

## 2018-07-07 DIAGNOSIS — M2242 Chondromalacia patellae, left knee: Secondary | ICD-10-CM | POA: Insufficient documentation

## 2018-07-07 DIAGNOSIS — M25862 Other specified joint disorders, left knee: Secondary | ICD-10-CM | POA: Insufficient documentation

## 2018-07-07 DIAGNOSIS — M6752 Plica syndrome, left knee: Secondary | ICD-10-CM | POA: Insufficient documentation

## 2018-07-09 DIAGNOSIS — Z9889 Other specified postprocedural states: Secondary | ICD-10-CM | POA: Insufficient documentation

## 2018-07-12 ENCOUNTER — Encounter: Payer: Self-pay | Admitting: Family Medicine

## 2018-10-09 ENCOUNTER — Other Ambulatory Visit: Payer: Self-pay

## 2018-10-09 ENCOUNTER — Encounter: Payer: Self-pay | Admitting: Family Medicine

## 2018-10-09 ENCOUNTER — Ambulatory Visit (INDEPENDENT_AMBULATORY_CARE_PROVIDER_SITE_OTHER): Payer: Self-pay | Admitting: Family Medicine

## 2018-10-09 DIAGNOSIS — M778 Other enthesopathies, not elsewhere classified: Secondary | ICD-10-CM | POA: Insufficient documentation

## 2018-10-09 DIAGNOSIS — M779 Enthesopathy, unspecified: Secondary | ICD-10-CM

## 2018-10-09 MED ORDER — NAPROXEN 500 MG PO TABS: ORAL_TABLET | ORAL | 0 refills | Status: DC

## 2018-10-09 MED ORDER — DICLOFENAC SODIUM 1 % TD GEL: 2.0000 g | Freq: Three times a day (TID) | TRANSDERMAL | 1 refills | Status: DC

## 2018-10-09 NOTE — Assessment & Plan Note (Addendum)
Anticipate R forearmtendonitis (likely abductor pollicis longus/extenso pollicis) after first day at work with repetitive arm motion. Rx naprosyn, voltaren topical, rec ice and stretching, discussed wrist brace. Light duty at work for the next week. Update if not improving with treatment.

## 2018-10-09 NOTE — Patient Instructions (Addendum)
You have forearm tendonitis. Treat with anti inflammatory course, ice to area (covered in a towel, 10-73min at a time), gentle stretching of the forearm.  Light duty at work for 1 week.  Consider wrist brace for support.

## 2018-10-09 NOTE — Progress Notes (Signed)
BP 128/80 (BP Location: Left Arm, Patient Position: Sitting, Cuff Size: Normal)   Pulse (!) 53   Temp 97.8 F (36.6 C) (Oral)   Ht 6' (1.829 m)   Wt 182 lb 7 oz (82.8 kg)   SpO2 98%   BMI 24.74 kg/m    CC: arm swelling Subjective:    Patient ID: Brian Spence, male    DOB: Feb 04, 1991, 28 y.o.   MRN: [id number]  HPI: Brian Spence is a 28 y.o. male presenting on 10/09/2018 for Arm Swelling (C/o right arm swelling radiating into hand. Has limited mobility in the wrist/hand. Also, c/o pain in the arm/hand. Sxs started 10/07/18. ) and Cough (Still c/o cough and chest congestion. Has noticed some improvement. )   2d ago noticed R arm wrist and hand pain and swelling. 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 No fevers/chills, felt feverish yesterday. Forearm feels warm. Some redness at wrist. Has tried tylenol, ace wrap. Denies inciting trauma/injury or fall.   R handed.  Recent L knee surgery 06/2018 Carolynne Edouard at [facility]) - recovering, going to physical therapy for this.   Dx influenza B 08/2018 by [facility] treated with tamiflu and prednisone and albuterol inhaler. Some persistent productive cough/congestion and PNdrainage that is slowly improving.      Relevant past medical, surgical, family and social history reviewed and updated as indicated. Interim medical history since our last visit reviewed. Allergies and medications reviewed and updated. Outpatient Medications Prior to Visit  Medication Sig Dispense Refill  . albuterol (PROVENTIL HFA;VENTOLIN HFA) 108 (90 Base) MCG/ACT inhaler Inhale 2 puffs into the lungs every 6 (six) hours as needed for wheezing. 1 Inhaler 2  . budesonide-formoterol (SYMBICORT) 80-4.5 MCG/ACT inhaler Inhale 2 puffs into the lungs 2 (two) times daily. 1 Inhaler 2  . fluticasone (FLONASE) 50 MCG/ACT nasal spray Place 2 sprays into both nostrils daily. (Patient taking differently:  Place 2 sprays into both nostrils daily as needed for allergies. ) 16 g 3  . hydrOXYzine (ATARAX/VISTARIL) 25 MG tablet TAKE 0.5-1 TABS BY MOUTH 2 TIMES DAILY AS NEEDED FOR ANXIETY **SEDATION PRECAUTIONS** 40 tablet 3  . venlafaxine XR (EFFEXOR-XR) 150 MG 24 hr capsule Take 1 capsule (150 mg total) by mouth daily with breakfast. 90 capsule 3   No facility-administered medications prior to visit.      Per HPI unless specifically indicated in ROS section below Review of Systems Objective:    BP 128/80 (BP Location: Left Arm, Patient Position: Sitting, Cuff Size: Normal)   Pulse (!) 53   Temp 97.8 F (36.6 C) (Oral)   Ht 6' (1.829 m)   Wt 182 lb 7 oz (82.8 kg)   SpO2 98%   BMI 24.74 kg/m   Wt Readings from Last 3 Encounters:  10/09/18 182 lb 7 oz (82.8 kg)  04/17/18 182 lb 8 oz (82.8 kg)  09/26/17 168 lb (76.2 kg)    Physical Exam Vitals signs and nursing note reviewed.  Constitutional:      Appearance: Normal appearance. He is not ill-appearing.  HENT:     Head: Normocephalic and atraumatic.     Nose: Nose normal. No congestion.     Mouth/Throat:     Mouth: Mucous membranes are moist.     Pharynx: No posterior oropharyngeal erythema.  Eyes:     Extraocular Movements: Extraocular movements intact.     Pupils: Pupils are equal,  round, and reactive to light.  Cardiovascular:     Rate and Rhythm: Normal rate and regular rhythm.     Pulses: Normal pulses.     Heart sounds: Normal heart sounds. No murmur.  Pulmonary:     Effort: Pulmonary effort is normal. No respiratory distress.     Breath sounds: Normal breath sounds. No wheezing, rhonchi or rales.  Musculoskeletal: Normal range of motion.        General: Swelling and tenderness present.       Arms:     Comments: 2+ DP bilaterally  L arm WNL R arm - tender swelling noted to right radial forearm with mild warmth and erythema without streaking. Painful with testing wrist extension, abduction against resistance. No pain at  [facility] or scaphoid. Mild discomfort with finkelstein test.   Lymphadenopathy:     Cervical: No cervical adenopathy.  Neurological:     Mental Status: He is alert.  Psychiatric:        Mood and Affect: Mood normal.        Behavior: Behavior normal.       Assessment & Plan:   Problem List Items Addressed This Visit    Forearm tendonitis    Anticipate R forearmtendonitis (likely abductor pollicis longus/extenso pollicis) after first day at work with repetitive arm motion. Rx naprosyn, voltaren topical, rec ice and stretching, discussed wrist brace. Light duty at work for the next week. Update if not improving with treatment.           Meds ordered this encounter  Medications  . naproxen (NAPROSYN) 500 MG tablet    Sig: Take one po bid x 1 week then prn pain, take with food    Dispense:  40 tablet    Refill:  0  . diclofenac sodium (VOLTAREN) 1 % GEL    Sig: Apply 2 g topically 3 (three) times daily.    Dispense:  1 Tube    Refill:  1   No orders of the defined types were placed in this encounter.   Patient Instructions  You have forearm tendonitis. Treat with anti inflammatory course, ice to area (covered in a towel, 10-16min at a time), gentle stretching of the forearm.  Light duty at work for 1 week.  Consider wrist brace for support.     Follow up plan: No follow-ups on file.  Brian Boyden, MD

## 2019-03-02 ENCOUNTER — Telehealth: Payer: Self-pay

## 2019-03-02 NOTE — Telephone Encounter (Signed)
Agree with [facility] ED eval given possible covid symptoms if [facility] would not see him.

## 2019-03-02 NOTE — Telephone Encounter (Signed)
Pt said dealing with anxiety on a daily basis; pt is not taking any meds due to cost/ pt recently got ins. Now pt has anxiety, vomiting, heaviness and tightness in chest,hard to breathe that started this morning at 8:30. Pt usually has shaking but shaking has worsened. Pt has h/a, loss of smell,chills, St/T,SOB, muscle pain but pt is always achy and diarrhea today.No SI/HI. Pt said that he is supposed to go to work today at [employer] but he cannot work like this. I advised pt after speaking with Dr Darnell Level that with his symptoms he does need to go to ED for eval. Pt said he will not go to ED and spoke with [facility] and they cannot see him due to anxiety and to go to ED. Advised pt with his symptoms he does need to be evaluated and pt said he would wait to see Dr Darnell Level next wk. I advised pt he cannot come into office with the possible covid symptoms and it would be a virtual visit; pt said no virtual visits. I asked pt to please go to ED for eval for his best pt care because we are concerned about his well being. Pt said OK he would go to [facility] ED. FYI to Dr Darnell Level.

## 2019-03-13 ENCOUNTER — Telehealth: Payer: Self-pay | Admitting: Family Medicine

## 2019-03-13 NOTE — Telephone Encounter (Signed)
Pt has a friend who sees [place] and said she really helps him with his anxiety so he wants to switch to [place]. Is it ok with both providers to set up transfer of care appt for patient? I let him know both providers have to agree before this appointment can be made.    CB [phone]

## 2019-03-13 NOTE — Telephone Encounter (Signed)
Nice guy, high anxiety. Ok by me if ok by [place].

## 2019-03-14 NOTE — Telephone Encounter (Signed)
Can we find out who his friend is?

## 2019-03-16 NOTE — Telephone Encounter (Signed)
Patient called back to find out if he can switch to [place].  He said he needs to know as soon as possible, so he can make an appointment and ask for the time off at work for the appointment.  Patient said his friend is [name].

## 2019-03-16 NOTE — Telephone Encounter (Signed)
Ok to switch 

## 2019-03-19 NOTE — Telephone Encounter (Signed)
L/m for patient to call back to schedule with [facility]

## 2019-04-17 ENCOUNTER — Other Ambulatory Visit: Payer: Self-pay

## 2019-04-17 ENCOUNTER — Encounter: Payer: Self-pay | Admitting: Internal Medicine

## 2019-04-17 ENCOUNTER — Ambulatory Visit: Payer: Commercial Managed Care - PPO | Admitting: Internal Medicine

## 2019-04-17 VITALS — BP 124/78 | HR 87 | Temp 97.5°F | Wt 173.0 lb

## 2019-04-17 DIAGNOSIS — F410 Panic disorder [episodic paroxysmal anxiety] without agoraphobia: Secondary | ICD-10-CM | POA: Diagnosis not present

## 2019-04-17 DIAGNOSIS — J45990 Exercise induced bronchospasm: Secondary | ICD-10-CM

## 2019-04-17 DIAGNOSIS — F329 Major depressive disorder, single episode, unspecified: Secondary | ICD-10-CM

## 2019-04-17 DIAGNOSIS — J320 Chronic maxillary sinusitis: Secondary | ICD-10-CM

## 2019-04-17 DIAGNOSIS — F32A Depression, unspecified: Secondary | ICD-10-CM

## 2019-04-17 DIAGNOSIS — F419 Anxiety disorder, unspecified: Secondary | ICD-10-CM | POA: Diagnosis not present

## 2019-04-17 MED ORDER — ALBUTEROL SULFATE HFA 108 (90 BASE) MCG/ACT IN AERS: 2.0000 | INHALATION_SPRAY | Freq: Four times a day (QID) | RESPIRATORY_TRACT | 2 refills | Status: AC | PRN

## 2019-04-17 MED ORDER — ALPRAZOLAM 0.25 MG PO TABS: 0.2500 mg | ORAL_TABLET | Freq: Every day | ORAL | 0 refills | Status: DC | PRN

## 2019-04-17 MED ORDER — BUDESONIDE-FORMOTEROL FUMARATE 80-4.5 MCG/ACT IN AERO: 2.0000 | INHALATION_SPRAY | Freq: Two times a day (BID) | RESPIRATORY_TRACT | 2 refills | Status: DC

## 2019-04-17 MED ORDER — PAROXETINE HCL 10 MG PO TABS: 10.0000 mg | ORAL_TABLET | Freq: Every day | ORAL | 2 refills | Status: DC
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
## 2019-04-18 NOTE — Patient Instructions (Signed)

## 2019-04-18 NOTE — Assessment & Plan Note (Signed)
Currently on antibiotics Will monitor for reoccurring symptoms

## 2019-04-18 NOTE — Assessment & Plan Note (Signed)
Support offered today Will trial Paxil as his anxiety seems social induced Discussed SE including possible SI- he understands if he has these feelings, he is to stop medication and notify me immediately RX for Xanax 0.25 mg PO daily prn- addiction and sedation caution given Will obtain CSA and UDS at next visit Referral to psychiatry placed 

## 2019-04-18 NOTE — Assessment & Plan Note (Signed)
Symbicort and Albuterol refilled today Encouraged smoking cessation- he declines at this time

## 2019-04-18 NOTE — Assessment & Plan Note (Signed)
Support offered today Will trial Paxil as his anxiety seems social induced Discussed SE including possible SI- he understands if he has these feelings, he is to stop medication and notify me immediately RX for Xanax 0.25 mg PO daily prn- addiction and sedation caution given Will obtain CSA and UDS at next visit Referral to psychiatry placed

## 2019-04-27 ENCOUNTER — Other Ambulatory Visit: Payer: Self-pay | Admitting: Internal Medicine

## 2019-05-16 ENCOUNTER — Encounter: Payer: Self-pay | Admitting: Internal Medicine

## 2019-05-17 ENCOUNTER — Other Ambulatory Visit: Payer: Self-pay | Admitting: Internal Medicine

## 2019-05-18 NOTE — Telephone Encounter (Signed)
Last Filled 04-17-19 #20 Last OV 04-17-19 No Future OV Here Appt with behavioral health next week [place]

## 2019-05-21 ENCOUNTER — Other Ambulatory Visit: Payer: Self-pay

## 2019-05-21 ENCOUNTER — Ambulatory Visit (INDEPENDENT_AMBULATORY_CARE_PROVIDER_SITE_OTHER): Payer: Commercial Managed Care - PPO | Admitting: Psychiatry

## 2019-05-21 ENCOUNTER — Encounter: Payer: Self-pay | Admitting: Psychiatry

## 2019-05-21 DIAGNOSIS — F411 Generalized anxiety disorder: Secondary | ICD-10-CM | POA: Diagnosis not present

## 2019-05-21 MED ORDER — PAROXETINE HCL 20 MG PO TABS: 20.0000 mg | ORAL_TABLET | Freq: Every day | ORAL | 1 refills | Status: DC
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
## 2019-06-11 ENCOUNTER — Other Ambulatory Visit: Payer: Self-pay

## 2019-06-11 DIAGNOSIS — Z20822 Contact with and (suspected) exposure to covid-19: Secondary | ICD-10-CM

## 2019-06-13 LAB — NOVEL CORONAVIRUS, NAA: SARS-CoV-2, NAA: NOT DETECTED

## 2019-06-19 ENCOUNTER — Other Ambulatory Visit: Payer: Self-pay | Admitting: Internal Medicine

## 2019-06-19 ENCOUNTER — Encounter: Payer: Self-pay | Admitting: Internal Medicine

## 2019-06-19 NOTE — Telephone Encounter (Signed)
Last filled 05/18/2019... please advise

## 2019-06-20 MED ORDER — ALPRAZOLAM 0.25 MG PO TABS
0.2500 mg | ORAL_TABLET | Freq: Every day | ORAL | 0 refills | Status: DC | PRN
Start: 2019-06-20 — End: 2019-07-18

## 2019-06-25 ENCOUNTER — Encounter: Payer: Self-pay | Admitting: Psychiatry

## 2019-06-25 ENCOUNTER — Other Ambulatory Visit: Payer: Self-pay

## 2019-06-25 ENCOUNTER — Ambulatory Visit (INDEPENDENT_AMBULATORY_CARE_PROVIDER_SITE_OTHER): Payer: Commercial Managed Care - PPO | Admitting: Psychiatry

## 2019-06-25 DIAGNOSIS — F411 Generalized anxiety disorder: Secondary | ICD-10-CM

## 2019-06-25 MED ORDER — PAROXETINE HCL 30 MG PO TABS: 30.0000 mg | ORAL_TABLET | Freq: Every day | ORAL | 1 refills | Status: DC
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
## 2019-07-03 ENCOUNTER — Other Ambulatory Visit: Payer: Self-pay

## 2019-07-03 ENCOUNTER — Telehealth (HOSPITAL_COMMUNITY): Payer: Self-pay | Admitting: Licensed Clinical Social Worker

## 2019-07-03 ENCOUNTER — Ambulatory Visit (HOSPITAL_COMMUNITY): Payer: Commercial Managed Care - PPO | Admitting: Licensed Clinical Social Worker

## 2019-07-03 NOTE — Telephone Encounter (Signed)
Clinician Leeland Lovelady today to complete initial assessment.  Hawkin reported that he would like to reschedule appointment today because of his job.  Yacob reported that he would prefer to reschedule for 07/06/19 at 8am.  Clinician will inform front desk staff and continue to monitor.    [address] [place], LCASA 07/03/19

## 2019-07-06 ENCOUNTER — Other Ambulatory Visit: Payer: Self-pay

## 2019-07-06 ENCOUNTER — Ambulatory Visit (INDEPENDENT_AMBULATORY_CARE_PROVIDER_SITE_OTHER): Payer: Commercial Managed Care - PPO | Admitting: Licensed Clinical Social Worker

## 2019-07-06 DIAGNOSIS — F322 Major depressive disorder, single episode, severe without psychotic features: Secondary | ICD-10-CM

## 2019-07-06 DIAGNOSIS — F411 Generalized anxiety disorder: Secondary | ICD-10-CM
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
## 2019-07-18 ENCOUNTER — Ambulatory Visit (INDEPENDENT_AMBULATORY_CARE_PROVIDER_SITE_OTHER): Payer: Commercial Managed Care - PPO | Admitting: Licensed Clinical Social Worker

## 2019-07-18 ENCOUNTER — Other Ambulatory Visit: Payer: Self-pay

## 2019-07-18 ENCOUNTER — Encounter: Payer: Self-pay | Admitting: Internal Medicine

## 2019-07-18 DIAGNOSIS — F322 Major depressive disorder, single episode, severe without psychotic features: Secondary | ICD-10-CM

## 2019-07-18 DIAGNOSIS — F411 Generalized anxiety disorder: Secondary | ICD-10-CM

## 2019-07-18 MED ORDER — ALPRAZOLAM 0.25 MG PO TABS: 0.2500 mg | ORAL_TABLET | Freq: Every day | ORAL | 0 refills | Status: DC | PRN

## 2019-07-18 NOTE — Progress Notes (Signed)
Clinician had a virtual therapy appointment with Brian Spence scheduled today at [facility] did not present for virtual Webex appointment, and did not answer phone call from clinician at 8:00am to remind him of this appointment.  Clinician left a voicemail encouraging Brian Spence to call back in case he forgot about appointment, or needs to reschedule for another day. Clinician included office phone number in this message.    [address] [place], [place] 07/18/19

## 2019-07-18 NOTE — Telephone Encounter (Signed)
Last filled 06/20/2019.... note to pharmacy [facility] on or after 07/19/2019... please advise if appropriate

## 2019-07-30 ENCOUNTER — Other Ambulatory Visit: Payer: Self-pay

## 2019-07-30 ENCOUNTER — Encounter: Payer: Self-pay | Admitting: Internal Medicine

## 2019-07-30 MED ORDER — ALPRAZOLAM 0.5 MG PO TABS: 0.5000 mg | ORAL_TABLET | Freq: Every evening | ORAL | 0 refills | Status: DC | PRN

## 2019-07-30 NOTE — Telephone Encounter (Signed)
Rx dose changed in October, see mychart msg to 0.5mg  Xanax.... Xanax 0.25 last filled 06/20/2019... Rx from 07/18/2019 was canceled with the pharmacy.Marland KitchenMarland Kitchen

## 2019-08-01 ENCOUNTER — Other Ambulatory Visit: Payer: Self-pay

## 2019-08-01 ENCOUNTER — Ambulatory Visit (INDEPENDENT_AMBULATORY_CARE_PROVIDER_SITE_OTHER): Payer: Commercial Managed Care - PPO | Admitting: Licensed Clinical Social Worker

## 2019-08-01 DIAGNOSIS — F322 Major depressive disorder, single episode, severe without psychotic features: Secondary | ICD-10-CM | POA: Diagnosis not present

## 2019-08-01 DIAGNOSIS — F411 Generalized anxiety disorder: Secondary | ICD-10-CM | POA: Diagnosis not present

## 2019-08-01 NOTE — Progress Notes (Signed)
Virtual Visit via Telephone Note   I connected with Brian Spence on 08/01/19 at 8:00am by telephone and verified that I am speaking with the correct person using two identifiers.   I discussed the limitations, risks, security and privacy concerns of performing an evaluation and management service by telephone and the availability of in person appointments. I also discussed with the patient that there may be a patient responsible charge related to this service. The patient expressed understanding and agreed to proceed.   I discussed the assessment and treatment plan with the patient. The patient was provided an opportunity to ask questions and all were answered. The patient agreed with the plan and demonstrated an understanding of the instructions.   The patient was advised to call back or seek an in-person evaluation if the symptoms worsen or if the condition fails to improve as anticipated.   I provided 30 minutes of non-face-to-face time during this encounter.     Shade Flood, LCSW, LCASA ______________________________ THERAPIST PROGRESS NOTE  Session Time: 8:00am - 8:30am  Participation Level: Active   Behavioral Response: Alert, depressed mood  Type of Therapy:  Individual Therapy  Treatment Goals addressed: Anxiety/depression reduction; medication management; sleep hygiene techniques   Interventions: CBT, solution focused   Summary: Brian Spence did not present for Webex appointment today, but did respond to phone call instead.  He spoke in a manner that was alert, oriented x5, with no evidence or self report of SI/HI or A/V H.  Brian Spence apologized for missing previous session and reported that his schedule has been hectic with work, and he was barely able to have this session today due to taking his son to school by 9am. Brian Spence reported scores of 10/10 for depression and 8/10 for anxiety and explained that his ratings are high today because his girlfriend asked him to move out, so  he is staying with his parents at the moment, and sad that he cannot see his son as regularly.  Brian Spence scored a 26 on the screening today and admitted to some passive SI without plan or intent as recently as Monday.  Brian Spence was agreeable to reaching out to his mother for assistance with crisis stabilization if needed should he begin to seriously contemplate suicide, and denied any thoughts or behaviors concerning this today.  Brian Spence reported that he is compliant with anxiety medication at this time, and will outreach psychiatrist this week due to only getting 1-4 hours of sleep nightly, in addition to increased depression.  Brian Spence reported that his plan this week is to continue looking for an apartment where he can keep his son some days, as well as follow up with a lawyer about getting previous charges expunged so he can find a job that will afford him a two bedroom apartment that his son can share. Brian Spence also acknowledged that he needs to socialize more, but cannot afford group treatment at this time, or fit it into his hectic weekly schedule.  He was agreeable to the idea of free online support groups, as well as getting engaged in fantasy football league again.  Brian Spence reported that he plans to begin working out again when he can reconcile his work schedule and visitation of son.  He reported that he would follow up in 1 month due to current financial situation.       Suicidal/Homicidal: None, without intent or plan.    Therapist Response: Clinician contacted Brian Spence by telephone today for therapy session when he did not present for  Webex appointment.  Clinician inquired about Brian Spence's absence from last session.  Clinician assessed for safety, and inquired about Brian Spence's present emotional ratings today, including significant changes in thoughts, feelings, and behavior since last conversation.  Clinician utilized PHQ9 screening tool to question [name] regarding his depression.  Clinician encouraged  Brian Spence to utilize his support system to get to the hospital for safety should SI increase during this episode of depression.  Clinician inquired about Brian Spence's present medication regimen, and recommended that he outreach his psychiatrist about appropriateness for medications that might assist him with sleep and depressive symptoms.  Clinician recommended sleep hygiene techniques such as keeping a regular routine, avoiding caffeine/sugar/big meals/nicotine 2-3 hours before bedtime, keeping a sleep journal, taking a hot shower before bed, and avoiding use of technology before bed to improve nightly rest.  Clinician inquired about Brian Spence's plans this week to stay productive and begin resolving recent issues that have arisen.  Clinician was supportive of Brian Spence's plan and inquired about whether he would be open to entering group therapy based upon recent problems and lack of support.  Clinician recommended Brian Spence consider free online support groups instead if alternative would not be feasible, and also consider engaging in a hobby that might afford more social interaction.  Clinician will continue to monitor.    Plan: Follow up in 1 month virtually.    Diagnosis: Generalized Anxiety Disorder; Major Depressive Disorder, Single Episode, Severe   Brian Spence, [place], LCASA 08/01/19

## 2019-08-07 ENCOUNTER — Encounter: Payer: Self-pay | Admitting: Psychiatry

## 2019-08-07 ENCOUNTER — Ambulatory Visit (INDEPENDENT_AMBULATORY_CARE_PROVIDER_SITE_OTHER): Payer: Commercial Managed Care - PPO | Admitting: Psychiatry

## 2019-08-07 ENCOUNTER — Other Ambulatory Visit: Payer: Self-pay

## 2019-08-07 DIAGNOSIS — F411 Generalized anxiety disorder: Secondary | ICD-10-CM

## 2019-08-07 DIAGNOSIS — F321 Major depressive disorder, single episode, moderate: Secondary | ICD-10-CM

## 2019-08-07 MED ORDER — PAROXETINE HCL 30 MG PO TABS: 30.0000 mg | ORAL_TABLET | Freq: Every day | ORAL | 1 refills | Status: DC
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
## 2019-08-21 ENCOUNTER — Ambulatory Visit: Payer: Commercial Managed Care - PPO | Admitting: Psychiatry

## 2019-08-22 ENCOUNTER — Other Ambulatory Visit: Payer: Self-pay | Admitting: Internal Medicine

## 2019-08-30 ENCOUNTER — Other Ambulatory Visit: Payer: Self-pay

## 2019-08-30 ENCOUNTER — Encounter: Payer: Self-pay | Admitting: Internal Medicine

## 2019-08-30 NOTE — Telephone Encounter (Signed)
Last filled 07/30/2019... please advise  

## 2019-08-31 NOTE — Telephone Encounter (Signed)
Can we call pharmacy and verify that he is picking up his paxil

## 2019-08-31 NOTE — Telephone Encounter (Signed)
Spoke to the pharmacy and they stated the pt picked up Rx on 08/26/2019

## 2019-09-01 MED ORDER — ALPRAZOLAM 0.5 MG PO TABS: 0.5000 mg | ORAL_TABLET | Freq: Every evening | ORAL | 0 refills | Status: DC | PRN

## 2019-09-27 ENCOUNTER — Encounter: Payer: Self-pay | Admitting: Internal Medicine

## 2019-09-27 NOTE — Telephone Encounter (Signed)
Xanax was last filled 09/01/2019, so should not be due until 10/01/2019... please advise

## 2019-09-28 MED ORDER — ALPRAZOLAM 0.5 MG PO TABS: 0.5000 mg | ORAL_TABLET | Freq: Every evening | ORAL | 0 refills | Status: DC | PRN

## 2019-09-28 NOTE — Telephone Encounter (Signed)
Yes, he has picked up Rx last month and yesterday

## 2019-09-28 NOTE — Telephone Encounter (Signed)
Xanax refilled.  

## 2019-09-28 NOTE — Telephone Encounter (Signed)
I will fill one more time on 3/8 but before I do that, can you call the pharmacy and see if he is picking up the Paxil?, but he needs to go back to [facility] for further refills.

## 2019-11-06 ENCOUNTER — Encounter: Payer: Self-pay | Admitting: Internal Medicine

## 2019-11-06 DIAGNOSIS — F411 Generalized anxiety disorder: Secondary | ICD-10-CM

## 2019-11-06 DIAGNOSIS — F321 Major depressive disorder, single episode, moderate: Secondary | ICD-10-CM

## 2019-11-07 MED ORDER — PAROXETINE HCL 30 MG PO TABS: 30.0000 mg | ORAL_TABLET | Freq: Every day | ORAL | 1 refills | Status: DC

## 2019-11-07 NOTE — Telephone Encounter (Signed)
I advised him 08/2019 that he needs to have this filled by psychiatry. He needs to call them. I will refill the Paxil

## 2019-11-07 NOTE — Telephone Encounter (Signed)
Xanax last filled 10/01/2019... looks like you started pt on Paxil 10mg  and Dr changed to 30mg ... pt wants you to refill the Paxil as well, pt states it is difficult getting a return call from Dr Evelene Croon office... please advise

## 2020-01-02 ENCOUNTER — Other Ambulatory Visit: Payer: Self-pay | Admitting: Internal Medicine

## 2020-01-02 DIAGNOSIS — F411 Generalized anxiety disorder: Secondary | ICD-10-CM

## 2020-01-02 DIAGNOSIS — F321 Major depressive disorder, single episode, moderate: Secondary | ICD-10-CM

## 2020-03-03 ENCOUNTER — Other Ambulatory Visit: Payer: Self-pay | Admitting: Internal Medicine

## 2020-03-03 DIAGNOSIS — F321 Major depressive disorder, single episode, moderate: Secondary | ICD-10-CM

## 2020-03-03 DIAGNOSIS — F411 Generalized anxiety disorder: Secondary | ICD-10-CM

## 2020-05-04 ENCOUNTER — Emergency Department
Admission: EM | Admit: 2020-05-04 | Discharge: 2020-05-04 | Disposition: A | Discharge status: Home / Self Care | Payer: Commercial Managed Care - PPO | Attending: Emergency Medicine | Admitting: Emergency Medicine

## 2020-05-04 ENCOUNTER — Other Ambulatory Visit: Payer: Self-pay

## 2020-05-04 DIAGNOSIS — J45909 Unspecified asthma, uncomplicated: Secondary | ICD-10-CM | POA: Insufficient documentation

## 2020-05-04 DIAGNOSIS — M79641 Pain in right hand: Secondary | ICD-10-CM | POA: Diagnosis present

## 2020-05-04 DIAGNOSIS — M130 Polyarthritis, unspecified: Secondary | ICD-10-CM | POA: Diagnosis not present

## 2020-05-04 DIAGNOSIS — Z7951 Long term (current) use of inhaled steroids: Secondary | ICD-10-CM | POA: Diagnosis not present

## 2020-05-04 DIAGNOSIS — R202 Paresthesia of skin: Secondary | ICD-10-CM | POA: Diagnosis not present

## 2020-05-04 DIAGNOSIS — F17210 Nicotine dependence, cigarettes, uncomplicated: Secondary | ICD-10-CM | POA: Diagnosis not present

## 2020-05-04 LAB — URINALYSIS, COMPLETE (UACMP) WITH MICROSCOPIC
Bilirubin Urine: NEGATIVE
Glucose, UA: NEGATIVE mg/dL
Hgb urine dipstick: NEGATIVE
Ketones, ur: NEGATIVE mg/dL
Leukocytes,Ua: NEGATIVE
Nitrite: NEGATIVE
Protein, ur: NEGATIVE mg/dL
Specific Gravity, Urine: 1.016 (ref 1.005–1.030)
pH: 9 — ABNORMAL HIGH (ref 5.0–8.0)

## 2020-05-04 LAB — CBC WITH DIFFERENTIAL/PLATELET
Abs Immature Granulocytes: 0.02 10*3/uL (ref 0.00–0.07)
Basophils Absolute: 0.1 10*3/uL (ref 0.0–0.1)
Basophils Relative: 1 %
Eosinophils Absolute: 0.4 10*3/uL (ref 0.0–0.5)
Eosinophils Relative: 5 %
HCT: 41.6 % (ref 39.0–52.0)
Hemoglobin: 14.3 g/dL (ref 13.0–17.0)
Immature Granulocytes: 0 %
Lymphocytes Relative: 26 %
Lymphs Abs: 1.8 10*3/uL (ref 0.7–4.0)
MCH: 31 pg (ref 26.0–34.0)
MCHC: 34.4 g/dL (ref 30.0–36.0)
MCV: 90.2 fL (ref 80.0–100.0)
Monocytes Absolute: 0.8 10*3/uL (ref 0.1–1.0)
Monocytes Relative: 11 %
Neutro Abs: 4 10*3/uL (ref 1.7–7.7)
Neutrophils Relative %: 57 %
Platelets: 268 10*3/uL (ref 150–400)
RBC: 4.61 MIL/uL (ref 4.22–5.81)
RDW: 12.8 % (ref 11.5–15.5)
WBC: 7.1 10*3/uL (ref 4.0–10.5)
nRBC: 0 % (ref 0.0–0.2)

## 2020-05-04 LAB — COMPREHENSIVE METABOLIC PANEL
ALT: 20 U/L (ref 0–44)
AST: 27 U/L (ref 15–41)
Albumin: 4.7 g/dL (ref 3.5–5.0)
Alkaline Phosphatase: 59 U/L (ref 38–126)
Anion gap: 10 (ref 5–15)
BUN: 13 mg/dL (ref 6–20)
CO2: 25 mmol/L (ref 22–32)
Calcium: 9 mg/dL (ref 8.9–10.3)
Chloride: 105 mmol/L (ref 98–111)
Creatinine, Ser: 0.87 mg/dL (ref 0.61–1.24)
GFR, Estimated: >60 mL/min (ref >60)
Glucose, Bld: 95 mg/dL (ref 70–99)
Potassium: 4.1 mmol/L (ref 3.5–5.1)
Sodium: 140 mmol/L (ref 135–145)
Total Bilirubin: 0.8 mg/dL (ref 0.3–1.2)
Total Protein: 7.6 g/dL (ref 6.5–8.1)

## 2020-05-04 LAB — LACTIC ACID, PLASMA: Lactic Acid, Venous: 1.2 mmol/L (ref 0.5–1.9)

## 2020-05-04 MED ORDER — KETOROLAC TROMETHAMINE 30 MG/ML IJ SOLN
30.0000 mg | Freq: Once | INTRAMUSCULAR | Status: AC
  Administered 2020-05-04: 30 mg via INTRAMUSCULAR
  Filled 2020-05-04: qty 1

## 2020-05-04 MED ORDER — NAPROXEN 500 MG PO TABS: 500.0000 mg | ORAL_TABLET | Freq: Two times a day (BID) | ORAL | 2 refills | Status: DC

## 2020-05-04 MED ORDER — DEXAMETHASONE SODIUM PHOSPHATE 10 MG/ML IJ SOLN
10.0000 mg | Freq: Once | INTRAMUSCULAR | Status: AC
  Administered 2020-05-04: 10 mg via INTRAMUSCULAR
  Filled 2020-05-04: qty 1

## 2020-05-04 NOTE — ED Triage Notes (Signed)
Pt ambulatory to triage. Seen at urgent care and given antibiotic and steroid on 9/30. Pt reports numbness and decreased mobility in rt hand due to swelling. Numbness extends up to the elbow. Swelling and redness noted on the right hand but does not appear to extend up the arm.  NAD noted at this time.
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
## 2020-05-07 ENCOUNTER — Ambulatory Visit: Payer: Commercial Managed Care - PPO | Admitting: Internal Medicine

## 2020-05-17 ENCOUNTER — Other Ambulatory Visit: Payer: Self-pay | Admitting: Internal Medicine

## 2020-05-17 DIAGNOSIS — F321 Major depressive disorder, single episode, moderate: Secondary | ICD-10-CM

## 2020-05-17 DIAGNOSIS — F411 Generalized anxiety disorder: Secondary | ICD-10-CM

## 2020-05-27 ENCOUNTER — Other Ambulatory Visit: Payer: Self-pay | Admitting: Rheumatology

## 2020-05-27 DIAGNOSIS — M064 Inflammatory polyarthropathy: Secondary | ICD-10-CM

## 2020-06-01 ENCOUNTER — Emergency Department
Admission: EM | Admit: 2020-06-01 | Discharge: 2020-06-01 | Disposition: A | Discharge status: Home / Self Care | Payer: Commercial Managed Care - PPO | Attending: Emergency Medicine | Admitting: Emergency Medicine

## 2020-06-01 ENCOUNTER — Emergency Department: Payer: Commercial Managed Care - PPO

## 2020-06-01 ENCOUNTER — Other Ambulatory Visit
Admission: RE | Admit: 2020-06-01 | Discharge: 2020-06-01 | Disposition: A | Discharge status: Home / Self Care | Payer: Commercial Managed Care - PPO | Source: Ambulatory Visit | Attending: Emergency Medicine | Admitting: Emergency Medicine

## 2020-06-01 DIAGNOSIS — J45909 Unspecified asthma, uncomplicated: Secondary | ICD-10-CM | POA: Insufficient documentation

## 2020-06-01 DIAGNOSIS — R109 Unspecified abdominal pain: Secondary | ICD-10-CM | POA: Diagnosis present

## 2020-06-01 DIAGNOSIS — F17210 Nicotine dependence, cigarettes, uncomplicated: Secondary | ICD-10-CM | POA: Diagnosis not present

## 2020-06-01 DIAGNOSIS — R1030 Lower abdominal pain, unspecified: Secondary | ICD-10-CM

## 2020-06-01 DIAGNOSIS — N490 Inflammatory disorders of seminal vesicle: Secondary | ICD-10-CM | POA: Insufficient documentation

## 2020-06-01 LAB — CBC WITH DIFFERENTIAL/PLATELET
Abs Immature Granulocytes: 0.01 10*3/uL (ref 0.00–0.07)
Basophils Absolute: 0.1 10*3/uL (ref 0.0–0.1)
Basophils Relative: 1 %
Eosinophils Absolute: 0.2 10*3/uL (ref 0.0–0.5)
Eosinophils Relative: 2 %
HCT: 38.7 % — ABNORMAL LOW (ref 39.0–52.0)
Hemoglobin: 13.3 g/dL (ref 13.0–17.0)
Immature Granulocytes: 0 %
Lymphocytes Relative: 41 %
Lymphs Abs: 4 10*3/uL (ref 0.7–4.0)
MCH: 30.6 pg (ref 26.0–34.0)
MCHC: 34.4 g/dL (ref 30.0–36.0)
MCV: 89.2 fL (ref 80.0–100.0)
Monocytes Absolute: 0.9 10*3/uL (ref 0.1–1.0)
Monocytes Relative: 10 %
Neutro Abs: 4.6 10*3/uL (ref 1.7–7.7)
Neutrophils Relative %: 46 %
Platelets: 247 10*3/uL (ref 150–400)
RBC: 4.34 MIL/uL (ref 4.22–5.81)
RDW: 12.3 % (ref 11.5–15.5)
WBC: 9.8 10*3/uL (ref 4.0–10.5)
nRBC: 0 % (ref 0.0–0.2)

## 2020-06-01 LAB — COMPREHENSIVE METABOLIC PANEL
ALT: 17 U/L (ref 0–44)
AST: 27 U/L (ref 15–41)
Albumin: 4.6 g/dL (ref 3.5–5.0)
Alkaline Phosphatase: 62 U/L (ref 38–126)
Anion gap: 10 (ref 5–15)
BUN: 16 mg/dL (ref 6–20)
CO2: 25 mmol/L (ref 22–32)
Calcium: 9 mg/dL (ref 8.9–10.3)
Chloride: 102 mmol/L (ref 98–111)
Creatinine, Ser: 0.79 mg/dL (ref 0.61–1.24)
GFR, Estimated: >60 mL/min (ref >60)
Glucose, Bld: 107 mg/dL — ABNORMAL HIGH (ref 70–99)
Potassium: 3.7 mmol/L (ref 3.5–5.1)
Sodium: 137 mmol/L (ref 135–145)
Total Bilirubin: 0.7 mg/dL (ref 0.3–1.2)
Total Protein: 7.6 g/dL (ref 6.5–8.1)

## 2020-06-01 LAB — URINALYSIS, COMPLETE (UACMP) WITH MICROSCOPIC
Bacteria, UA: NONE SEEN
Bilirubin Urine: NEGATIVE
Glucose, UA: NEGATIVE mg/dL
Ketones, ur: NEGATIVE mg/dL
Leukocytes,Ua: NEGATIVE
Nitrite: NEGATIVE
Protein, ur: NEGATIVE mg/dL
Specific Gravity, Urine: 1.017 (ref 1.005–1.030)
Squamous Epithelial / HPF: NONE SEEN (ref 0–5)
pH: 6 (ref 5.0–8.0)

## 2020-06-01 LAB — CHLAMYDIA/NGC RT PCR (ARMC ONLY)
Chlamydia Tr: NOT DETECTED
N gonorrhoeae: NOT DETECTED

## 2020-06-01 MED ORDER — KETOROLAC TROMETHAMINE 30 MG/ML IJ SOLN
15.0000 mg | Freq: Once | INTRAMUSCULAR | Status: AC
  Administered 2020-06-01: 15 mg via INTRAVENOUS
  Filled 2020-06-01: qty 1

## 2020-06-01 MED ORDER — MORPHINE SULFATE (PF) 4 MG/ML IV SOLN
4.0000 mg | Freq: Once | INTRAVENOUS | Status: AC
  Administered 2020-06-01: 4 mg via INTRAVENOUS
  Filled 2020-06-01: qty 1

## 2020-06-01 MED ORDER — DOXYCYCLINE HYCLATE 100 MG PO CAPS: 100.0000 mg | ORAL_CAPSULE | Freq: Two times a day (BID) | ORAL | 0 refills | Status: AC

## 2020-06-01 MED ORDER — HYDROCODONE-ACETAMINOPHEN 5-325 MG PO TABS
1.0000 | ORAL_TABLET | Freq: Four times a day (QID) | ORAL | 0 refills | Status: AC | PRN
Start: 2020-06-01 — End: 2021-06-01

## 2020-06-01 MED ORDER — ONDANSETRON 4 MG PO TBDP: 4.0000 mg | ORAL_TABLET | Freq: Three times a day (TID) | ORAL | 0 refills | Status: DC | PRN

## 2020-06-01 MED ORDER — IOHEXOL 300 MG/ML  SOLN
100.0000 mL | Freq: Once | INTRAMUSCULAR | Status: AC | PRN
  Administered 2020-06-01: 100 mL via INTRAVENOUS

## 2020-06-01 MED ORDER — ONDANSETRON HCL 4 MG/2ML IJ SOLN
4.0000 mg | Freq: Once | INTRAMUSCULAR | Status: AC
  Administered 2020-06-01: 4 mg via INTRAVENOUS
  Filled 2020-06-01: qty 2

## 2020-06-01 MED ORDER — CEFTRIAXONE SODIUM 250 MG IJ SOLR
250.0000 mg | Freq: Once | INTRAMUSCULAR | Status: AC
  Administered 2020-06-01: 250 mg via INTRAMUSCULAR
  Filled 2020-06-01: qty 250

## 2020-06-01 MED ORDER — LACTATED RINGERS IV BOLUS
1000.0000 mL | Freq: Once | INTRAVENOUS | Status: AC
  Administered 2020-06-01: 1000 mL via INTRAVENOUS
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
## 2020-06-01 NOTE — ED Notes (Signed)
Pt is aware that dirty urine sample is needed

## 2020-06-01 NOTE — ED Notes (Signed)
Pt reports that he has had right lower quad abd pain since Thursday with associated nausea Pt states that the pain radiates across the lower abd Pt denies back pain  Pt denies difficulty with urination or BM's

## 2020-06-01 NOTE — ED Triage Notes (Signed)
Triaged on paper during down time. 

## 2020-06-02 LAB — URINE CULTURE: Culture: NO GROWTH

## 2020-06-10 ENCOUNTER — Other Ambulatory Visit: Payer: Self-pay | Admitting: Internal Medicine

## 2020-06-10 DIAGNOSIS — F411 Generalized anxiety disorder: Secondary | ICD-10-CM

## 2020-06-10 DIAGNOSIS — F321 Major depressive disorder, single episode, moderate: Secondary | ICD-10-CM

## 2020-06-11 ENCOUNTER — Ambulatory Visit: Admission: RE | Admit: 2020-06-11 | Payer: Commercial Managed Care - PPO | Source: Ambulatory Visit

## 2020-06-11 ENCOUNTER — Ambulatory Visit: Payer: Commercial Managed Care - PPO

## 2020-06-16 ENCOUNTER — Encounter: Payer: Self-pay | Admitting: Internal Medicine

## 2020-06-16 DIAGNOSIS — F321 Major depressive disorder, single episode, moderate: Secondary | ICD-10-CM

## 2020-06-16 DIAGNOSIS — F411 Generalized anxiety disorder: Secondary | ICD-10-CM

## 2020-06-17 MED ORDER — PAROXETINE HCL 30 MG PO TABS: 30.0000 mg | ORAL_TABLET | Freq: Every day | ORAL | 0 refills | Status: DC

## 2020-09-25 ENCOUNTER — Other Ambulatory Visit: Payer: Self-pay | Admitting: Internal Medicine

## 2020-09-25 DIAGNOSIS — F411 Generalized anxiety disorder: Secondary | ICD-10-CM

## 2020-09-25 DIAGNOSIS — F321 Major depressive disorder, single episode, moderate: Secondary | ICD-10-CM

## 2021-01-05 ENCOUNTER — Other Ambulatory Visit: Payer: Self-pay | Admitting: Internal Medicine

## 2021-01-05 DIAGNOSIS — F411 Generalized anxiety disorder: Secondary | ICD-10-CM

## 2021-01-05 DIAGNOSIS — F321 Major depressive disorder, single episode, moderate: Secondary | ICD-10-CM

## 2021-01-07 ENCOUNTER — Encounter: Payer: Self-pay | Admitting: Internal Medicine

## 2021-02-14 ENCOUNTER — Telehealth: Payer: 59 | Admitting: Nurse Practitioner

## 2021-02-14 ENCOUNTER — Encounter: Payer: Self-pay | Admitting: Nurse Practitioner

## 2021-02-14 DIAGNOSIS — J0101 Acute recurrent maxillary sinusitis: Secondary | ICD-10-CM | POA: Diagnosis not present

## 2021-02-14 MED ORDER — AMOXICILLIN-POT CLAVULANATE 875-125 MG PO TABS: 1.0000 | ORAL_TABLET | Freq: Two times a day (BID) | ORAL | 0 refills | Status: DC

## 2021-02-14 NOTE — Patient Instructions (Signed)

## 2021-02-14 NOTE — Progress Notes (Signed)
Virtual Visit Consent   Brian Spence, you are scheduled for a virtual visit with Mary-Margaret Daphine Deutscher, FNP, a [facility] provider, today.     Just as with appointments in the office, your consent must be obtained to participate.  Your consent will be active for this visit and any virtual visit you may have with one of our providers in the next 365 days.     If you have a MyChart account, a copy of this consent can be sent to you electronically.  All virtual visits are billed to your insurance company just like a traditional visit in the office.    As this is a virtual visit, video technology does not allow for your provider to perform a traditional examination.  This may limit your provider's ability to fully assess your condition.  If your provider identifies any concerns that need to be evaluated in person or the need to arrange testing (such as labs, EKG, etc.), we will make arrangements to do so.     Although advances in technology are sophisticated, we cannot ensure that it will always work on either your end or our end.  If the connection with a video visit is poor, the visit may have to be switched to a telephone visit.  With either a video or telephone visit, we are not always able to ensure that we have a secure connection.     I need to obtain your verbal consent now.   Are you willing to proceed with your visit today? YES   Brian Spence has provided verbal consent on 02/14/2021 for a virtual visit (video or telephone).   Mary-Margaret Daphine Deutscher, FNP   Date: 02/14/2021 9:14 AM   Virtual Visit via Video Note   I, Mary-Margaret Daphine Deutscher, connected with Brian Spence ([id number], 10-18-1990) on 02/14/21 at  9:15 AM EDT by a video-enabled telemedicine application and verified that I am speaking with the correct person using two identifiers.  Location: Patient: Virtual Visit Location Patient: Home Provider: Virtual Visit Location Provider: Mobile   I discussed the  limitations of evaluation and management by telemedicine and the availability of in person appointments. The patient expressed understanding and agreed to proceed.    History of Present Illness: Brian Spence is a 30 y.o. who identifies as a male who was assigned male at birth, and is being seen today for sinusitis.  HPI:  Patient c/o facial pressure, slight cough and congestion, started Monday and has gotten worse. He has been taking ibuprofen and mucinex.    Review of Systems  Constitutional:  Negative for chills and fever.  HENT:  Positive for congestion, sinus pain and sore throat (slight).   Respiratory:  Positive for cough. Negative for sputum production and shortness of breath.   Musculoskeletal:  Negative for myalgias.  Neurological:  Positive for headaches.   Problems:  Patient Active Problem List   Diagnosis Date Noted   MDD (major depressive disorder), single episode, moderate (HCC) 08/07/2019   Generalized anxiety disorder 05/21/2019   S/P arthroscopic surgery of left knee 07/09/2018   Chondromalacia of left patella 07/07/2018   Impingement syndrome involving patellar fat pad of left knee 07/07/2018   Plica syndrome, left knee 07/07/2018   Left knee pain 05/08/2018   Hypertrophy of both inferior nasal turbinates 11/26/2016   Nasal septal deviation 11/26/2016   Anxiety and depression 09/20/2014   Lumbar paraspinal muscle spasm 09/02/2014   Lumbar radiculitis 09/02/2014   Fracture of zygomaticomaxillary complex (  HCC) 03/11/2014   Orbital floor fracture (HCC) 02/09/2014   Sinusitis, chronic 10/31/2013   Panic attacks 06/21/2011   Exercise-induced asthma     Allergies:  Allergies  Allergen Reactions   Lyrica [Pregabalin]     Chest pain   Zoloft [Sertraline Hcl] Other (See Comments)    Sharp headache and dizziness   Medications:  Current Outpatient Medications:    albuterol (VENTOLIN HFA) 108 (90 Base) MCG/ACT inhaler, Inhale 2 puffs into the lungs every 6  (six) hours as needed for wheezing., Disp: 18 g, Rfl: 2   ALPRAZolam (XANAX) 0.5 MG tablet, Take 1 tablet (0.5 mg total) by mouth at bedtime as needed for anxiety., Disp: 20 tablet, Rfl: 0   budesonide-formoterol (SYMBICORT) 80-4.5 MCG/ACT inhaler, Inhale 2 puffs into the lungs 2 (two) times daily., Disp: 1 Inhaler, Rfl: 2   celecoxib (CELEBREX) 100 MG capsule, Take 100 mg by mouth 2 (two) times daily., Disp: , Rfl:    folic acid (FOLVITE) 1 MG tablet, Take 1 mg by mouth daily., Disp: , Rfl:    HYDROcodone-acetaminophen (NORCO/VICODIN) 5-325 MG tablet, Take 1 tablet by mouth every 6 (six) hours as needed for severe pain., Disp: 6 tablet, Rfl: 0   methotrexate (RHEUMATREX) 2.5 MG tablet, Take 15 mg by mouth once a week., Disp: , Rfl:    ondansetron (ZOFRAN ODT) 4 MG disintegrating tablet, Take 1 tablet (4 mg total) by mouth every 8 (eight) hours as needed for nausea or vomiting., Disp: 20 tablet, Rfl: 0   PARoxetine (PAXIL) 30 MG tablet, TAKE 1 TABLET (30 MG TOTAL) BY MOUTH DAILY. MUST SCHEDULE PHYSICAL EXAM, Disp: 90 tablet, Rfl: 0  Observations/Objective: Patient is well-developed, well-nourished in no acute distress.  Resting comfortably  at home.  Head is normocephalic, atraumatic.  No labored breathing.  Speech is clear and coherent with logical content.  Patient is alert and oriented at baseline.  Voice hoarse.  Assessment and Plan: Brian Spence in today with chief complaint of Sinusitis   1. Acute recurrent maxillary sinusitis 1. Take meds as prescribed 2. Use a cool mist humidifier especially during the winter months and when heat has been humid. 3. Use saline nose sprays frequently 4. Saline irrigations of the nose can be very helpful if done frequently.  * 4X daily for 1 week*  * Use of a nettie pot can be helpful with this. Follow directions with this* 5. Drink plenty of fluids 6. Keep thermostat turn down low 7.For any cough or congestion  Use plain Mucinex- regular  strength or max strength is fine   * Children- consult with Pharmacist for dosing 8. For fever or aces or pains- take tylenol or ibuprofen appropriate for age and weight.  * for fevers greater than 101 orally you may alternate ibuprofen and tylenol every  3 hours.   Meds ordered this encounter  Medications   amoxicillin-clavulanate (AUGMENTIN) 875-125 MG tablet    Sig: Take 1 tablet by mouth 2 (two) times daily.    Dispense:  14 tablet    Refill:  0    Order Specific Question:   Supervising Provider    Answer:   Eber Hong [[id number]]           Follow Up Instructions: I discussed the assessment and treatment plan with the patient. The patient was provided an opportunity to ask questions and all were answered. The patient agreed with the plan and demonstrated an understanding of the instructions.  A copy of instructions were  sent to the patient via MyChart.  The patient was advised to call back or seek an in-person evaluation if the symptoms worsen or if the condition fails to improve as anticipated.  Time:  I spent 10 minutes with the patient via telehealth technology discussing the above problems/concerns.    Mary-Margaret Daphine Deutscher, FNP

## 2021-04-06 ENCOUNTER — Ambulatory Visit
Admission: EM | Admit: 2021-04-06 | Discharge: 2021-04-06 | Disposition: A | Discharge status: Home / Self Care | Payer: 59 | Attending: Emergency Medicine | Admitting: Emergency Medicine

## 2021-04-06 ENCOUNTER — Ambulatory Visit (INDEPENDENT_AMBULATORY_CARE_PROVIDER_SITE_OTHER): Payer: 59

## 2021-04-06 ENCOUNTER — Encounter: Payer: Self-pay | Admitting: Emergency Medicine

## 2021-04-06 ENCOUNTER — Other Ambulatory Visit: Payer: Self-pay

## 2021-04-06 DIAGNOSIS — M25522 Pain in left elbow: Secondary | ICD-10-CM | POA: Diagnosis not present

## 2021-04-06 DIAGNOSIS — W19XXXA Unspecified fall, initial encounter: Secondary | ICD-10-CM

## 2021-04-06 DIAGNOSIS — M25532 Pain in left wrist: Secondary | ICD-10-CM | POA: Diagnosis not present

## 2021-04-06 NOTE — Discharge Instructions (Addendum)
Follow up with PCP to inform of visit and treatment in clinic today as orthopedic or PT referral may be needed if symptoms persist longer than 2-3 weeks.  Increase fluid intake. Apply ice to affected area 3-5 times daily for 15-20 minute intervals. May take 1000 mg Tylenol  OR 600 mg ibuprofen every 8 hours as needed for pain as long as neither medication is contraindicated to your current health conditions. Return to clinic if symptoms worsen. If you experience shortness of breath, chest pain, dizziness, fainting or severe headache go to the ER.

## 2021-04-06 NOTE — ED Triage Notes (Signed)
Pt had mechanical fall on Saturday and caught himself on the right arm. Today presents with wrist swelling and pain, elbow pain and shoulder pain. Mobility from the elbow down is limited.

## 2021-04-06 NOTE — ED Provider Notes (Signed)
Chief Complaint   Chief Complaint  Patient presents with   Fall   Arm Injury     Subjective, HPI  Brian Spence is a 30 y.o. male who presents with left upper extremity pain after falling on an outstretched hand Saturday.  Patient reports that he fell to a concrete pavement area.  Patient does report some discomfort to the left shoulder as well, and reports some shooting pain.  Patient reports the range of motion is decreased from his left elbow through his left wrist.  No additional injuries reported.  History obtained from patient.  Patient's problem list, past medical and social history, medications, and allergies were reviewed by me and updated in Epic.    ROS  See HPI.  Objective   Vitals:   04/06/21 0837  BP: 134/90  Pulse: 68  Resp: 18  Temp: 98.4 F (36.9 C)  SpO2: 98%    Vital signs and nursing note reviewed.   General: Appears well-developed and well-nourished. No acute distress.  Head: Normocephalic and atraumatic.   Neck: Normal range of motion, neck is supple.  Cardiovascular: Normal rate. Pulm/Chest: No respiratory distress.  Musculoskeletal: Left upper extremity: Mild TTP noted to insertion point of left bicep.  Moderate TTP noted to medial aspect of left elbow without laxity to joint.  Moderate TTP noted to ulnar aspect of left wrist.  No TTP noted to scaphoid.  5/5 strength, full sensation, 2+  pulses, < 2 sec cap refill.  Neurological: Alert and oriented to person, place, and time.  Skin: Skin is warm and dry.   Psychiatric: Normal mood, affect, behavior, and thought content.    Data  No results found for any visits on 04/06/21.   Imaging Left wrist: On my read, no fracture or dislocation. Pending final interpretation. Left elbow: On my read, no fracture or dislocation. Pending final interpretation.   Assessment & Plan  1. Left wrist pain - Apply ace wrap; Standing - Apply ace wrap  2. Left elbow pain  3. Fall, initial encounter  30  y.o. male presents with left upper extremity pain after falling on an outstretched hand Saturday.  Patient reports that he fell to a concrete pavement area.  Patient does report some discomfort to the left shoulder as well, and reports some shooting pain.  Patient reports the range of motion is decreased from his left elbow through his left wrist.  No additional injuries reported.  Chart review completed.  Left wrist: On my read,no fracture or dislocation.  Left elbow, on my read,no fracture or dislocation.Likely, L wrist and elbow pain s/p fall without fracture.  Advised to follow-up with PCP or Ortho if his symptoms persist for longer than 2 to 3 weeks.  Advised at home treatment and care to include Tylenol, rest, ice, ibuprofen and Ace wrap as applied in clinic today.  Return as needed for any worsening symptoms.  ER for severe symptoms such as shortness of breath, chest pain, dizziness, fainting or severe headache.  Patient verbalized understanding and agreed with plan.  Patient stable upon discharge.  Return as needed. Work note provided.  Plan:   Discharge Instructions      Follow up with PCP to inform of visit and treatment in clinic today as orthopedic or PT referral may be needed if symptoms persist longer than 2-3 weeks.  Increase fluid intake. Apply ice to affected area 3-5 times daily for 15-20 minute intervals. May take 1000 mg Tylenol  OR 600 mg ibuprofen every  8 hours as needed for pain as long as neither medication is contraindicated to your current health conditions. Return to clinic if symptoms worsen. If you experience shortness of breath, chest pain, dizziness, fainting or severe headache go to the ER.          Amalia Greenhouse, [place] 04/06/21 [phone]

## 2021-06-05 ENCOUNTER — Telehealth: Payer: 59 | Admitting: Physician Assistant

## 2021-06-05 DIAGNOSIS — J019 Acute sinusitis, unspecified: Secondary | ICD-10-CM | POA: Diagnosis not present

## 2021-06-05 DIAGNOSIS — B9689 Other specified bacterial agents as the cause of diseases classified elsewhere: Secondary | ICD-10-CM | POA: Diagnosis not present

## 2021-06-05 MED ORDER — PSEUDOEPH-BROMPHEN-DM 30-2-10 MG/5ML PO SYRP: 5.0000 mL | ORAL_SOLUTION | Freq: Four times a day (QID) | ORAL | 0 refills | Status: DC | PRN

## 2021-06-05 MED ORDER — AMOXICILLIN-POT CLAVULANATE 875-125 MG PO TABS: 1.0000 | ORAL_TABLET | Freq: Two times a day (BID) | ORAL | 0 refills | Status: DC

## 2021-06-05 MED ORDER — BENZONATATE 100 MG PO CAPS: 100.0000 mg | ORAL_CAPSULE | Freq: Three times a day (TID) | ORAL | 0 refills | Status: DC | PRN

## 2021-06-05 NOTE — Patient Instructions (Signed)
Camille Bal, thank you for joining Margaretann Loveless, PA-C for today's virtual visit.  While this provider is not your primary care provider (PCP), if your PCP is located in our provider database this encounter information will be shared with them immediately following your visit.  Consent: (Patient) Brian Spence Tiffany provided verbal consent for this virtual visit at the beginning of the encounter.  Current Medications:  Current Outpatient Medications:    amoxicillin-clavulanate (AUGMENTIN) 875-125 MG tablet, Take 1 tablet by mouth 2 (two) times daily., Disp: 14 tablet, Rfl: 0   benzonatate (TESSALON) 100 MG capsule, Take 1 capsule (100 mg total) by mouth 3 (three) times daily as needed., Disp: 30 capsule, Rfl: 0   brompheniramine-pseudoephedrine-DM 30-2-10 MG/5ML syrup, Take 5 mLs by mouth 4 (four) times daily as needed., Disp: 120 mL, Rfl: 0   albuterol (VENTOLIN HFA) 108 (90 Base) MCG/ACT inhaler, Inhale 2 puffs into the lungs every 6 (six) hours as needed for wheezing., Disp: 18 g, Rfl: 2   ALPRAZolam (XANAX) 0.5 MG tablet, Take 1 tablet (0.5 mg total) by mouth at bedtime as needed for anxiety., Disp: 20 tablet, Rfl: 0   budesonide-formoterol (SYMBICORT) 80-4.5 MCG/ACT inhaler, Inhale 2 puffs into the lungs 2 (two) times daily., Disp: 1 Inhaler, Rfl: 2   celecoxib (CELEBREX) 100 MG capsule, Take 100 mg by mouth 2 (two) times daily., Disp: , Rfl:    folic acid (FOLVITE) 1 MG tablet, Take 1 mg by mouth daily., Disp: , Rfl:    methotrexate (RHEUMATREX) 2.5 MG tablet, Take 15 mg by mouth once a week., Disp: , Rfl:    ondansetron (ZOFRAN ODT) 4 MG disintegrating tablet, Take 1 tablet (4 mg total) by mouth every 8 (eight) hours as needed for nausea or vomiting., Disp: 20 tablet, Rfl: 0   PARoxetine (PAXIL) 30 MG tablet, TAKE 1 TABLET (30 MG TOTAL) BY MOUTH DAILY. MUST SCHEDULE PHYSICAL EXAM, Disp: 90 tablet, Rfl: 0   Medications ordered in this encounter:  Meds ordered this encounter   Medications   amoxicillin-clavulanate (AUGMENTIN) 875-125 MG tablet    Sig: Take 1 tablet by mouth 2 (two) times daily.    Dispense:  14 tablet    Refill:  0    Order Specific Question:   Supervising Provider    Answer:   MILLER, BRIAN [[id number]]   benzonatate (TESSALON) 100 MG capsule    Sig: Take 1 capsule (100 mg total) by mouth 3 (three) times daily as needed.    Dispense:  30 capsule    Refill:  0    Order Specific Question:   Supervising Provider    Answer:   Hyacinth Meeker, BRIAN [[id number]]   brompheniramine-pseudoephedrine-DM 30-2-10 MG/5ML syrup    Sig: Take 5 mLs by mouth 4 (four) times daily as needed.    Dispense:  120 mL    Refill:  0    Order Specific Question:   Supervising Provider    Answer:   Hyacinth Meeker, BRIAN [[id number]]     *If you need refills on other medications prior to your next appointment, please contact your pharmacy*  Follow-Up: Call back or seek an in-person evaluation if the symptoms worsen or if the condition fails to improve as anticipated.  Other Instructions Sinusitis, Adult Sinusitis is soreness and swelling (inflammation) of your sinuses. Sinuses are hollow spaces in the bones around your face. They are located: Around your eyes. In the middle of your forehead. Behind your nose. In your cheekbones. Your sinuses and nasal passages  are lined with a fluid called mucus. Mucus drains out of your sinuses. Swelling can trap mucus in your sinuses. This lets germs (bacteria, virus, or fungus) grow, which leads to infection. Most of the time, this condition is caused by a virus. What are the causes? This condition is caused by: Allergies. Asthma. Germs. Things that block your nose or sinuses. Growths in the nose (nasal polyps). Chemicals or irritants in the air. Fungus (rare). What increases the risk? You are more likely to develop this condition if: You have a weak body defense system (immune system). You do a lot of swimming or diving. You use nasal sprays too  much. You smoke. What are the signs or symptoms? The main symptoms of this condition are pain and a feeling of pressure around the sinuses. Other symptoms include: Stuffy nose (congestion). Runny nose (drainage). Swelling and warmth in the sinuses. Headache. Toothache. A cough that may get worse at night. Mucus that collects in the throat or the back of the nose (postnasal drip). Being unable to smell and taste. Being very tired (fatigue). A fever. Sore throat. Bad breath. How is this diagnosed? This condition is diagnosed based on: Your symptoms. Your medical history. A physical exam. Tests to find out if your condition is short-term (acute) or long-term (chronic). Your doctor may: Check your nose for growths (polyps). Check your sinuses using a tool that has a light (endoscope). Check for allergies or germs. Do imaging tests, such as an MRI or CT scan. How is this treated? Treatment for this condition depends on the cause and whether it is short-term or long-term. If caused by a virus, your symptoms should go away on their own within 10 days. You may be given medicines to relieve symptoms. They include: Medicines that shrink swollen tissue in the nose. Medicines that treat allergies (antihistamines). A spray that treats swelling of the nostrils.  Rinses that help get rid of thick mucus in your nose (nasal saline washes). If caused by bacteria, your doctor may wait to see if you will get better without treatment. You may be given antibiotic medicine if you have: A very bad infection. A weak body defense system. If caused by growths in the nose, you may need to have surgery. Follow these instructions at home: Medicines Take, use, or apply over-the-counter and prescription medicines only as told by your doctor. These may include nasal sprays. If you were prescribed an antibiotic medicine, take it as told by your doctor. Do not stop taking the antibiotic even if you start to  feel better. Hydrate and humidify  Drink enough water to keep your pee (urine) pale yellow. Use a cool mist humidifier to keep the humidity level in your home above 50%. Breathe in steam for 10-15 minutes, 3-4 times a day, or as told by your doctor. You can do this in the bathroom while a hot shower is running. Try not to spend time in cool or dry air. Rest Rest as much as you can. Sleep with your head raised (elevated). Make sure you get enough sleep each night. General instructions  Put a warm, moist washcloth on your face 3-4 times a day, or as often as told by your doctor. This will help with discomfort. Wash your hands often with soap and water. If there is no soap and water, use hand sanitizer. Do not smoke. Avoid being around people who are smoking (secondhand smoke). Keep all follow-up visits as told by your doctor. This is important. Contact  a doctor if: You have a fever. Your symptoms get worse. Your symptoms do not get better within 10 days. Get help right away if: You have a very bad headache. You cannot stop throwing up (vomiting). You have very bad pain or swelling around your face or eyes. You have trouble seeing. You feel confused. Your neck is stiff. You have trouble breathing. Summary Sinusitis is swelling of your sinuses. Sinuses are hollow spaces in the bones around your face. This condition is caused by tissues in your nose that become inflamed or swollen. This traps germs. These can lead to infection. If you were prescribed an antibiotic medicine, take it as told by your doctor. Do not stop taking it even if you start to feel better. Keep all follow-up visits as told by your doctor. This is important. This information is not intended to replace advice given to you by your health care provider. Make sure you discuss any questions you have with your health care provider. Document Revised: 12/12/2017 Document Reviewed: 12/12/2017 Elsevier Patient Education   2022 [employer].    If you have been instructed to have an in-person evaluation today at a local Urgent Care facility, please use the link below. It will take you to a list of all of our available [facility], including address, phone number and hours of operation. Please do not delay care.  [facility]  If you or a family member do not have a primary care provider, use the link below to schedule a visit and establish care. When you choose a [facility] primary care physician or advanced practice provider, you gain a long-term partner in health. Find a Primary Care Provider  Learn more about [facility]'s in-office and virtual care options: [facility] - Get Care Now

## 2021-06-05 NOTE — Progress Notes (Signed)
Virtual Visit Consent   [name], you are scheduled for a virtual visit with a [facility] provider today.     Just as with appointments in the office, your consent must be obtained to participate.  Your consent will be active for this visit and any virtual visit you may have with one of our providers in the next 365 days.     If you have a MyChart account, a copy of this consent can be sent to you electronically.  All virtual visits are billed to your insurance company just like a traditional visit in the office.    As this is a virtual visit, video technology does not allow for your provider to perform a traditional examination.  This may limit your provider's ability to fully assess your condition.  If your provider identifies any concerns that need to be evaluated in person or the need to arrange testing (such as labs, EKG, etc.), we will make arrangements to do so.     Although advances in technology are sophisticated, we cannot ensure that it will always work on either your end or our end.  If the connection with a video visit is poor, the visit may have to be switched to a telephone visit.  With either a video or telephone visit, we are not always able to ensure that we have a secure connection.     I need to obtain your verbal consent now.   Are you willing to proceed with your visit today?    CHAIM GATLEY has provided verbal consent on 06/05/2021 for a virtual visit (video or telephone).   Margaretann Loveless, PA-C   Date: 06/05/2021 1:47 PM   Virtual Visit via Video Note   I, Margaretann Loveless, connected with  Brian Spence  ([id number], 1991-03-19) on 06/05/21 at  1:45 PM EST by a video-enabled telemedicine application and verified that I am speaking with the correct person using two identifiers.  Location: Patient: Virtual Visit Location Patient: Home Provider: Virtual Visit Location Provider: Home Office   I discussed the limitations of evaluation and  management by telemedicine and the availability of in person appointments. The patient expressed understanding and agreed to proceed.    History of Present Illness: Brian Spence is a 30 y.o. who identifies as a male who was assigned male at birth, and is being seen today for possible sinus infection.  HPI: Sinusitis This is a new problem. The current episode started 1 to 4 weeks ago. The problem has been gradually worsening since onset. Maximum temperature: subjective fever reported. The pain is moderate. Associated symptoms include congestion, coughing (nighttime cough), headaches, sinus pressure and a sore throat. Pertinent negatives include no ear pain. Treatments tried: robitussin, vicks, tylenol. The treatment provided no relief.     Problems:  Patient Active Problem List   Diagnosis Date Noted   MDD (major depressive disorder), single episode, moderate (HCC) 08/07/2019   Generalized anxiety disorder 05/21/2019   S/P arthroscopic surgery of left knee 07/09/2018   Chondromalacia of left patella 07/07/2018   Impingement syndrome involving patellar fat pad of left knee 07/07/2018   Plica syndrome, left knee 07/07/2018   Left knee pain 05/08/2018   Hypertrophy of both inferior nasal turbinates 11/26/2016   Nasal septal deviation 11/26/2016   Anxiety and depression 09/20/2014   Lumbar paraspinal muscle spasm 09/02/2014   Lumbar radiculitis 09/02/2014   Fracture of zygomaticomaxillary complex (HCC) 03/11/2014   Orbital floor fracture (HCC) 02/09/2014  Sinusitis, chronic 10/31/2013   Panic attacks 06/21/2011   Exercise-induced asthma     Allergies:  Allergies  Allergen Reactions   Lyrica [Pregabalin]     Chest pain   Zoloft [Sertraline Hcl] Other (See Comments)    Sharp headache and dizziness   Medications:  Current Outpatient Medications:    amoxicillin-clavulanate (AUGMENTIN) 875-125 MG tablet, Take 1 tablet by mouth 2 (two) times daily., Disp: 14 tablet, Rfl: 0    benzonatate (TESSALON) 100 MG capsule, Take 1 capsule (100 mg total) by mouth 3 (three) times daily as needed., Disp: 30 capsule, Rfl: 0   brompheniramine-pseudoephedrine-DM 30-2-10 MG/5ML syrup, Take 5 mLs by mouth 4 (four) times daily as needed., Disp: 120 mL, Rfl: 0   albuterol (VENTOLIN HFA) 108 (90 Base) MCG/ACT inhaler, Inhale 2 puffs into the lungs every 6 (six) hours as needed for wheezing., Disp: 18 g, Rfl: 2   ALPRAZolam (XANAX) 0.5 MG tablet, Take 1 tablet (0.5 mg total) by mouth at bedtime as needed for anxiety., Disp: 20 tablet, Rfl: 0   budesonide-formoterol (SYMBICORT) 80-4.5 MCG/ACT inhaler, Inhale 2 puffs into the lungs 2 (two) times daily., Disp: 1 Inhaler, Rfl: 2   celecoxib (CELEBREX) 100 MG capsule, Take 100 mg by mouth 2 (two) times daily., Disp: , Rfl:    folic acid (FOLVITE) 1 MG tablet, Take 1 mg by mouth daily., Disp: , Rfl:    methotrexate (RHEUMATREX) 2.5 MG tablet, Take 15 mg by mouth once a week., Disp: , Rfl:    ondansetron (ZOFRAN ODT) 4 MG disintegrating tablet, Take 1 tablet (4 mg total) by mouth every 8 (eight) hours as needed for nausea or vomiting., Disp: 20 tablet, Rfl: 0   PARoxetine (PAXIL) 30 MG tablet, TAKE 1 TABLET (30 MG TOTAL) BY MOUTH DAILY. MUST SCHEDULE PHYSICAL EXAM, Disp: 90 tablet, Rfl: 0  Observations/Objective: Patient is well-developed, well-nourished in no acute distress.  Resting comfortably at home.  Head is normocephalic, atraumatic.  No labored breathing.  Speech is clear and coherent with logical content.  Patient is alert and oriented at baseline.    Assessment and Plan: 1. Acute bacterial sinusitis - amoxicillin-clavulanate (AUGMENTIN) 875-125 MG tablet; Take 1 tablet by mouth 2 (two) times daily.  Dispense: 14 tablet; Refill: 0 - benzonatate (TESSALON) 100 MG capsule; Take 1 capsule (100 mg total) by mouth 3 (three) times daily as needed.  Dispense: 30 capsule; Refill: 0 - brompheniramine-pseudoephedrine-DM 30-2-10 MG/5ML syrup;  Take 5 mLs by mouth 4 (four) times daily as needed.  Dispense: 120 mL; Refill: 0  - Worsening symptoms that have not responded to OTC medications.  - Will give augmentin  - Bromfed Dm and tessalon perles for cough - Continue allergy medications.  - Stay well hydrated and get plenty of rest.  - Call or seek in person evaluation if no symptom improvement or if symptoms worsen.  Follow Up Instructions: I discussed the assessment and treatment plan with the patient. The patient was provided an opportunity to ask questions and all were answered. The patient agreed with the plan and demonstrated an understanding of the instructions.  A copy of instructions were sent to the patient via MyChart unless otherwise noted below.    The patient was advised to call back or seek an in-person evaluation if the symptoms worsen or if the condition fails to improve as anticipated.  Time:  I spent 10 minutes with the patient via telehealth technology discussing the above problems/concerns.    Margaretann Loveless, PA-C

## 2021-08-13 ENCOUNTER — Telehealth: Payer: 59 | Admitting: Physician Assistant

## 2021-08-13 DIAGNOSIS — R6889 Other general symptoms and signs: Secondary | ICD-10-CM

## 2021-08-13 MED ORDER — BENZONATATE 100 MG PO CAPS: 100.0000 mg | ORAL_CAPSULE | Freq: Three times a day (TID) | ORAL | 0 refills | Status: DC | PRN

## 2021-08-13 NOTE — Progress Notes (Signed)
E visit for Flu like symptoms   We are sorry that you are not feeling well.  Here is how we plan to help! Based on what you have shared with me it looks like you may have a respiratory virus that may be influenza.  Influenza or the flu is   an infection caused by a respiratory virus. The flu virus is highly contagious and persons who did not receive their yearly flu vaccination may catch the flu from close contact.  We have anti-viral medications to treat the viruses that cause this infection. They are not a cure and only shorten the course of the infection. These prescriptions are most effective when they are given within the first 2 days of flu symptoms. Antiviral medication are indicated if you have a high risk of complications from the flu. You should  also consider an antiviral medication if you are in close contact with someone who is at risk. These medications can help patients avoid complications from the flu  but have side effects that you should know. Possible side effects from Tamiflu or oseltamivir include nausea, vomiting, diarrhea, dizziness, headaches, eye redness, sleep problems or other respiratory symptoms. You should not take Tamiflu if you have an allergy to oseltamivir or any to the ingredients in Tamiflu.  Unfortunately giving over 2 days of symptoms, you are outside the window for an antiviral medication for the flu. I do recommend home COVID testing just to make certain this is negative as if positive, you would be a candidate for COVID antivirals (have to be started in 1st 5 days)  Based upon your symptoms and potential risk factors I recommend that you follow the flu symptoms recommendation that I have listed below.  If you do not have a history of heart disease, hypertension, diabetes or thyroid disease, prostate/bladder issues or glaucoma, you may also use Sudafed to treat nasal congestion.  It is highly recommended that you consult with a pharmacist or your primary  care physician to ensure this medication is safe for you to take.     If you have a cough, you may use cough suppressants such as Delsym and Robitussin.  If you have glaucoma or high blood pressure, you can also use Coricidin HBP.   For cough I have prescribed for you A prescription cough medication called Tessalon Perles 100 mg. You may take 1-2 capsules every 8 hours as needed for cough  If you have a sore or scratchy throat, use a saltwater gargle-  to  teaspoon of salt dissolved in a 4-ounce to 8-ounce glass of warm water.  Gargle the solution for approximately 15-30 seconds and then spit.  It is important not to swallow the solution.  You can also use throat lozenges/cough drops and Chloraseptic spray to help with throat pain or discomfort.  Warm or cold liquids can also be helpful in relieving throat pain.  For headache, pain or general discomfort, you can use Ibuprofen or Tylenol as directed.   Some authorities believe that zinc sprays or the use of Echinacea may shorten the course of your symptoms.   ANYONE WHO HAS FLU SYMPTOMS SHOULD: Stay home. The flu is highly contagious and going out or to work exposes others! Be sure to drink plenty of fluids. Water is fine as well as fruit juices, sodas and electrolyte beverages. You may want to stay away from caffeine or alcohol. If you are nauseated, try taking small sips of liquids. How do you know if you are  getting enough fluid? Your urine should be a pale yellow or almost colorless. Get rest. Taking a steamy shower or using a humidifier may help nasal congestion and ease sore throat pain. Using a saline nasal spray works much the same way. Cough drops, hard candies and sore throat lozenges may ease your cough. Line up a caregiver. Have someone check on you regularly.   GET HELP RIGHT AWAY IF: You cannot keep down liquids or your medications. You become short of breath Your fell like you are going to pass out or loose consciousness. Your  symptoms persist after you have completed your treatment plan MAKE SURE YOU  Understand these instructions. Will watch your condition. Will get help right away if you are not doing well or get worse.  Your e-visit answers were reviewed by a board certified advanced clinical practitioner to complete your personal care plan.  Depending on the condition, your plan could have included both over the counter or prescription medications.  If there is a problem please reply  once you have received a response from your provider.  Your safety is important to [place].  If you have drug allergies check your prescription carefully.    You can use MyChart to ask questions about todays visit, request a non-urgent call back, or ask for a work or school excuse for 24 hours related to this e-Visit. If it has been greater than 24 hours you will need to follow up with your provider, or enter a new e-Visit to address those concerns.  You will get an e-mail in the next two days asking about your experience.  I hope that your e-visit has been valuable and will speed your recovery. Thank you for using e-visits.

## 2021-08-13 NOTE — Progress Notes (Signed)
I have spent 5 minutes in review of e-visit questionnaire, review and updating patient chart, medical decision making and response to patient.   Calder Oblinger Cody Adaiah Morken, PA-C    

## 2022-04-28 ENCOUNTER — Ambulatory Visit: Payer: Self-pay | Admitting: Physical Therapy

## 2022-04-28 ENCOUNTER — Ambulatory Visit: Payer: 59 | Admitting: Physical Therapy

## 2023-01-17 ENCOUNTER — Emergency Department: Payer: Managed Care, Other (non HMO)

## 2023-01-17 ENCOUNTER — Other Ambulatory Visit: Payer: Self-pay

## 2023-01-17 ENCOUNTER — Emergency Department
Admission: EM | Admit: 2023-01-17 | Discharge: 2023-01-17 | Disposition: A | Discharge status: Home / Self Care | Payer: Managed Care, Other (non HMO) | Attending: Emergency Medicine | Admitting: Emergency Medicine

## 2023-01-17 DIAGNOSIS — J45909 Unspecified asthma, uncomplicated: Secondary | ICD-10-CM | POA: Insufficient documentation

## 2023-01-17 DIAGNOSIS — K859 Acute pancreatitis without necrosis or infection, unspecified: Secondary | ICD-10-CM | POA: Insufficient documentation

## 2023-01-17 DIAGNOSIS — R1031 Right lower quadrant pain: Secondary | ICD-10-CM

## 2023-01-17 LAB — COMPREHENSIVE METABOLIC PANEL
ALT: 60 U/L — ABNORMAL HIGH (ref 0–44)
AST: 48 U/L — ABNORMAL HIGH (ref 15–41)
Albumin: 4.3 g/dL (ref 3.5–5.0)
Alkaline Phosphatase: 76 U/L (ref 38–126)
Anion gap: 8 (ref 5–15)
BUN: 15 mg/dL (ref 6–20)
CO2: 26 mmol/L (ref 22–32)
Calcium: 8.9 mg/dL (ref 8.9–10.3)
Chloride: 106 mmol/L (ref 98–111)
Creatinine, Ser: 0.77 mg/dL (ref 0.61–1.24)
GFR, Estimated: >60 mL/min (ref >60)
Glucose, Bld: 90 mg/dL (ref 70–99)
Potassium: 3.7 mmol/L (ref 3.5–5.1)
Sodium: 140 mmol/L (ref 135–145)
Total Bilirubin: 0.8 mg/dL (ref 0.3–1.2)
Total Protein: 7.2 g/dL (ref 6.5–8.1)

## 2023-01-17 LAB — CBC
HCT: 43.2 % (ref 39.0–52.0)
Hemoglobin: 14.3 g/dL (ref 13.0–17.0)
MCH: 30 pg (ref 26.0–34.0)
MCHC: 33.1 g/dL (ref 30.0–36.0)
MCV: 90.6 fL (ref 80.0–100.0)
Platelets: 250 10*3/uL (ref 150–400)
RBC: 4.77 MIL/uL (ref 4.22–5.81)
RDW: 12.3 % (ref 11.5–15.5)
WBC: 5.3 10*3/uL (ref 4.0–10.5)
nRBC: 0 % (ref 0.0–0.2)

## 2023-01-17 LAB — LIPASE, BLOOD: Lipase: 112 U/L — ABNORMAL HIGH (ref 11–51)

## 2023-01-17 MED ORDER — IOHEXOL 350 MG/ML SOLN: 100.0000 mL | Freq: Once | INTRAVENOUS | Status: DC | PRN

## 2023-01-17 MED ORDER — IOHEXOL 300 MG/ML  SOLN
100.0000 mL | Freq: Once | INTRAMUSCULAR | Status: AC | PRN
  Administered 2023-01-17: 100 mL via INTRAVENOUS

## 2023-01-17 NOTE — ED Provider Notes (Signed)
[facility] Provider Note    Event Date/Time   First MD Initiated Contact with Patient 01/17/23 [id number]     (approximate)  History   Chief Complaint: Abdominal Pain (RLQ pain with palpation - went to [place], labs show elevated liver enzymes and lipase, referred here for further imaging)  HPI  Brian Spence is a 32 y.o. male with a past medical history of anxiety, asthma, presents emergency department for right lower quadrant abdominal pain.  According to the patient for the past 2 months or so he has been experiencing intermittent pain in the right lower quadrant.  Patient states a history of a femoral neck fracture which she sees physical therapy.  States he was at physical therapy today and was complaining of some right lower quadrant abdominal pain and they recommended he come to the ED for a CT scan to rule out appendicitis.  Patient states he has been experiencing intermittent abdominal pain over the last 2 to 3 months somewhat worse recently he states.  Denies any fever.  States rare alcohol.  No significant nausea vomiting or diarrhea.  No fever.  Physical Exam   Triage Vital Signs: ED Triage Vitals [01/17/23 1622]  Enc Vitals Group     BP (!) 133/90     Pulse Rate 67     Resp 17     Temp 98.2 F (36.8 C)     Temp Source Oral     SpO2 97 %     Weight      Height      Head Circumference      Peak Flow      Pain Score      Pain Loc      Pain Edu?      Excl. in GC?     Most recent vital signs: Vitals:   01/17/23 1622  BP: (!) 133/90  Pulse: 67  Resp: 17  Temp: 98.2 F (36.8 C)  SpO2: 97%    General: Awake, no distress.  CV:  Good peripheral perfusion.  Regular rate and rhythm  Resp:  Normal effort.  Equal breath sounds bilaterally.  Abd:  No distention.  Soft, nontender.  No rebound or guarding.  ED Results / Procedures / Treatments   RADIOLOGY  I have reviewed and interpreted CT images I do not see any obvious significant  finding. Radiology states mild diffuse fatty liver, no gallstones, normal appendix.   MEDICATIONS ORDERED IN ED: Medications  iohexol (OMNIPAQUE) 300 MG/ML solution 100 mL (100 mLs Intravenous Contrast Given 01/17/23 1821)     IMPRESSION / MDM / ASSESSMENT AND PLAN / ED COURSE  I reviewed the triage vital signs and the nursing notes.  Patient's presentation is most consistent with acute presentation with potential threat to life or bodily function.  Patient presents to the emergency department for right lower quadrant abdominal pain intermittent over the last 2 to 3 months per patient but somewhat worse recently.  Overall the patient appears well, no distress, reassuring physical exam with no tenderness elicited throughout my exam of the abdomen.  Patient's lab work does show a normal CBC with a normal white blood cell count, slight LFT elevation as well as slight lipase elevation.  I spoke to the patient more in depth.  He states he was drinking very heavily but he has since cut back recently but he did drink last night per patient.  CT scan performed shows no significant finding no gallstones, does have  fatty liver.  Discussed with the patient given the mild lipase elevation to abstain from alcohol use and limit fatty foods in his diet.  Given the patient's otherwise reassuring workup reassuring physical exam I believe the patient will be safe for discharge home with outpatient follow-up.  Patient agreeable to plan of care.  FINAL CLINICAL IMPRESSION(S) / ED DIAGNOSES  Abdominal pain Mild pancreatitis   Note:  This document was prepared using Dragon voice recognition software and may include unintentional dictation errors.   Minna Antis, MD 01/17/23 1902

## 2023-01-17 NOTE — Discharge Instructions (Addendum)
As we discussed your workup in the emergency department is reassuring.  The labs looking at your pancreas are slightly elevated.  Please avoid alcohol and adhere to a very low-fat diet over the next 1 week.  I have attached a pancreatitis eating plan for your review.  Return to the emergency department for any worsening pain fever or any other symptom personally concerning to yourself.

## 2023-03-17 ENCOUNTER — Emergency Department
Admission: EM | Admit: 2023-03-17 | Discharge: 2023-03-17 | Disposition: A | Discharge status: Home / Self Care | Payer: Managed Care, Other (non HMO) | Attending: Emergency Medicine | Admitting: Emergency Medicine

## 2023-03-17 ENCOUNTER — Emergency Department: Payer: Managed Care, Other (non HMO)

## 2023-03-17 DIAGNOSIS — N433 Hydrocele, unspecified: Secondary | ICD-10-CM | POA: Diagnosis not present

## 2023-03-17 DIAGNOSIS — N50812 Left testicular pain: Secondary | ICD-10-CM

## 2023-03-17 DIAGNOSIS — J45909 Unspecified asthma, uncomplicated: Secondary | ICD-10-CM | POA: Insufficient documentation

## 2023-03-17 LAB — CBC WITH DIFFERENTIAL/PLATELET
Abs Immature Granulocytes: 0.03 10*3/uL (ref 0.00–0.07)
Basophils Absolute: 0 10*3/uL (ref 0.0–0.1)
Basophils Relative: 1 %
Eosinophils Absolute: 0.1 10*3/uL (ref 0.0–0.5)
Eosinophils Relative: 2 %
HCT: 41.6 % (ref 39.0–52.0)
Hemoglobin: 14.5 g/dL (ref 13.0–17.0)
Immature Granulocytes: 0 %
Lymphocytes Relative: 18 %
Lymphs Abs: 1.5 10*3/uL (ref 0.7–4.0)
MCH: 31 pg (ref 26.0–34.0)
MCHC: 34.9 g/dL (ref 30.0–36.0)
MCV: 88.9 fL (ref 80.0–100.0)
Monocytes Absolute: 1 10*3/uL (ref 0.1–1.0)
Monocytes Relative: 13 %
Neutro Abs: 5.3 10*3/uL (ref 1.7–7.7)
Neutrophils Relative %: 66 %
Platelets: 268 10*3/uL (ref 150–400)
RBC: 4.68 MIL/uL (ref 4.22–5.81)
RDW: 13.1 % (ref 11.5–15.5)
WBC: 7.9 10*3/uL (ref 4.0–10.5)
nRBC: 0 % (ref 0.0–0.2)

## 2023-03-17 LAB — BASIC METABOLIC PANEL
Anion gap: 7 (ref 5–15)
BUN: 16 mg/dL (ref 6–20)
CO2: 22 mmol/L (ref 22–32)
Calcium: 8.8 mg/dL — ABNORMAL LOW (ref 8.9–10.3)
Chloride: 110 mmol/L (ref 98–111)
Creatinine, Ser: 0.86 mg/dL (ref 0.61–1.24)
GFR, Estimated: >60 mL/min (ref >60)
Glucose, Bld: 89 mg/dL (ref 70–99)
Potassium: 4.1 mmol/L (ref 3.5–5.1)
Sodium: 139 mmol/L (ref 135–145)

## 2023-03-17 LAB — URINALYSIS, ROUTINE W REFLEX MICROSCOPIC
Bacteria, UA: NONE SEEN
Bilirubin Urine: NEGATIVE
Glucose, UA: NEGATIVE mg/dL
Ketones, ur: NEGATIVE mg/dL
Leukocytes,Ua: NEGATIVE
Nitrite: NEGATIVE
Protein, ur: NEGATIVE mg/dL
Specific Gravity, Urine: 1.021 (ref 1.005–1.030)
Squamous Epithelial / HPF: NONE SEEN /HPF (ref 0–5)
pH: 5 (ref 5.0–8.0)

## 2023-03-17 LAB — CHLAMYDIA/NGC RT PCR (ARMC ONLY)
Chlamydia Tr: NOT DETECTED
N gonorrhoeae: NOT DETECTED

## 2023-03-17 MED ORDER — KETOROLAC TROMETHAMINE 30 MG/ML IJ SOLN
15.0000 mg | Freq: Once | INTRAMUSCULAR | Status: AC
  Administered 2023-03-17: 15 mg via INTRAVENOUS
  Filled 2023-03-17: qty 1

## 2023-03-17 NOTE — ED Provider Notes (Signed)
[facility] Provider Note    Event Date/Time   First MD Initiated Contact with Patient 03/17/23 [phone]     (approximate)   History   Chief Complaint Testicle Pain   HPI  Brian Spence is a 32 y.o. male with past medical history of asthma and anxiety who presents to the ED complaining of testicular pain.  Patient reports that over the past 2 days he has been dealing with increasing pain and swelling around his left testicle, which she states is sore to touch.  He denies any trauma to this area, has begun to notice milder pain and swelling around his right testicle.  He states that it "feels funny" when he urinates, but he denies any dysuria, hematuria, or penile discharge.  He has not had any rashes to the area and denies any fevers, nausea, or vomiting.  He has had recent diarrhea.  He is sexually active with 1 partner.     Physical Exam   Triage Vital Signs: ED Triage Vitals [03/17/23 0721]  Encounter Vitals Group     BP (!) 130/91     Systolic BP Percentile      Diastolic BP Percentile      Pulse Rate 86     Resp 18     Temp 98.1 F (36.7 C)     Temp Source Oral     SpO2 99 %     Weight      Height      Head Circumference      Peak Flow      Pain Score 9     Pain Loc      Pain Education      Exclude from Growth Chart     Most recent vital signs: Vitals:   03/17/23 0721  BP: (!) 130/91  Pulse: 86  Resp: 18  Temp: 98.1 F (36.7 C)  SpO2: 99%    Constitutional: Alert and oriented. Eyes: Conjunctivae are normal. Head: Atraumatic. Nose: No congestion/rhinnorhea. Mouth/Throat: Mucous membranes are moist.  Cardiovascular: Normal rate, regular rhythm. Grossly normal heart sounds.  2+ radial pulses bilaterally. Respiratory: Normal respiratory effort.  No retractions. Lungs CTAB. Gastrointestinal: Soft and nontender. No distention. Genitourinary: Left testicle with tender vascular appearing nodule, no significant erythema, edema, or  warmth noted.  Minimal right testicular tenderness to palpation.  No penile lesions noted. Musculoskeletal: No lower extremity tenderness nor edema.  Neurologic:  Normal speech and language. No gross focal neurologic deficits are appreciated.    ED Results / Procedures / Treatments   Labs (all labs ordered are listed, but only abnormal results are displayed) Labs Reviewed  URINALYSIS, ROUTINE W REFLEX MICROSCOPIC - Abnormal; Notable for the following components:      Result Value   Color, Urine YELLOW (*)    APPearance CLEAR (*)    Hgb urine dipstick MODERATE (*)    All other components within normal limits  BASIC METABOLIC PANEL - Abnormal; Notable for the following components:   Calcium 8.8 (*)    All other components within normal limits  CHLAMYDIA/NGC RT PCR ([facility] ONLY)            CBC WITH DIFFERENTIAL/PLATELET    RADIOLOGY Testicular ultrasound reviewed and interpreted by me with no evidence of torsion.  PROCEDURES:  Critical Care performed: No  Procedures   MEDICATIONS ORDERED IN ED: Medications  ketorolac (TORADOL) 30 MG/ML injection 15 mg (15 mg Intravenous Given 03/17/23 0813)     IMPRESSION /  MDM / ASSESSMENT AND PLAN / ED COURSE  I reviewed the triage vital signs and the nursing notes.                              32 y.o. male with past medical history of asthma and anxiety who presents to the ED complaining of left testicular pain and swelling over the past 2 days.  Patient's presentation is most consistent with acute presentation with potential threat to life or bodily function.  Differential diagnosis includes, but is not limited to, testicular torsion, cellulitis, epididymitis, abscess, inguinal hernia.  Patient nontoxic-appearing and in no acute distress, vital signs are unremarkable.  He has tender nodularity to his left testicle but no evidence of cellulitis or abscess on exam and no findings concerning for inguinal hernia.  We will further assess  with ultrasound, labs, and urinalysis.  We will treat symptomatically with IV Toradol and reassess following labs and imaging.  Testicular ultrasound with no evidence of torsion, does show small hydrocele that could be contributing to patient's swelling and pain.  No other acute findings noted, labs are reassuring with no significant anemia, leukocytosis, tract abnormality, or AKI.  Urinalysis with no signs of infection and low suspicion for STI at this time.  Patient appropriate for discharge home with urology follow-up as needed, was counseled to return to the ED for new or worsening symptoms.  Patient agrees with plan.      FINAL CLINICAL IMPRESSION(S) / ED DIAGNOSES   Final diagnoses:  Pain in left testicle  Hydrocele, unspecified hydrocele type     Rx / DC Orders   ED Discharge Orders     None        Note:  This document was prepared using Dragon voice recognition software and may include unintentional dictation errors.   Chesley Noon, MD 03/17/23 [phone]

## 2023-03-17 NOTE — ED Notes (Signed)
D/C discussed with pt, pt verbalized understanding NAD noted. Pt ambulatory this steady gait on D/C.

## 2023-03-17 NOTE — ED Triage Notes (Signed)
Pt to ED via POV from home. Pt reports testicular swelling and pain that started on Tuesday and has been intermittent. Pt reports swelling and pain worsened last night and this morning. Pt reports swelling more to left testicle and groin. Pt also reports pressure to LLQ. Pt reports increased urinary frequency.

## 2023-03-24 ENCOUNTER — Ambulatory Visit: Payer: Managed Care, Other (non HMO) | Admitting: Urology

## 2023-03-24 ENCOUNTER — Ambulatory Visit
Admission: RE | Admit: 2023-03-24 | Discharge: 2023-03-24 | Disposition: A | Discharge status: Home / Self Care | Payer: Managed Care, Other (non HMO) | Source: Ambulatory Visit | Attending: Urology | Admitting: Urology

## 2023-03-24 VITALS — BP 126/79 | HR 66 | Ht 72.0 in | Wt 182.5 lb

## 2023-03-24 DIAGNOSIS — N528 Other male erectile dysfunction: Secondary | ICD-10-CM | POA: Diagnosis not present

## 2023-03-24 DIAGNOSIS — N50812 Left testicular pain: Secondary | ICD-10-CM

## 2023-03-24 MED ORDER — SILDENAFIL CITRATE 20 MG PO TABS
ORAL_TABLET | ORAL | 11 refills | Status: DC
Start: 2023-03-24 — End: 2023-08-09
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
## 2023-03-25 MED ORDER — MELOXICAM 15 MG PO TABS: 15.0000 mg | ORAL_TABLET | Freq: Every day | ORAL | 0 refills | Status: DC

## 2023-08-08 ENCOUNTER — Encounter: Payer: Self-pay | Admitting: Urology

## 2023-08-09 ENCOUNTER — Ambulatory Visit: Payer: Managed Care, Other (non HMO) | Admitting: Urology

## 2023-08-09 VITALS — BP 144/79 | HR 92 | Ht 72.0 in | Wt 184.0 lb

## 2023-08-09 DIAGNOSIS — N528 Other male erectile dysfunction: Secondary | ICD-10-CM

## 2023-08-09 DIAGNOSIS — R300 Dysuria: Secondary | ICD-10-CM

## 2023-08-09 DIAGNOSIS — N411 Chronic prostatitis: Secondary | ICD-10-CM | POA: Diagnosis not present

## 2023-08-09 DIAGNOSIS — R361 Hematospermia: Secondary | ICD-10-CM | POA: Diagnosis not present

## 2023-08-09 LAB — MICROSCOPIC EXAMINATION: Bacteria, UA: NONE SEEN

## 2023-08-09 LAB — URINALYSIS, COMPLETE
Bilirubin, UA: NEGATIVE
Glucose, UA: NEGATIVE
Ketones, UA: NEGATIVE
Leukocytes,UA: NEGATIVE
Nitrite, UA: NEGATIVE
Protein,UA: NEGATIVE
RBC, UA: NEGATIVE
Specific Gravity, UA: 1.015 (ref 1.005–1.030)
Urobilinogen, Ur: 0.2 mg/dL (ref 0.2–1.0)
pH, UA: 7 (ref 5.0–7.5)

## 2023-08-09 MED ORDER — MELOXICAM 15 MG PO TABS
15.0000 mg | ORAL_TABLET | Freq: Every day | ORAL | 2 refills | Status: AC
Start: 2023-08-09 — End: 2023-09-20

## 2023-08-09 MED ORDER — TAMSULOSIN HCL 0.4 MG PO CAPS
0.4000 mg | ORAL_CAPSULE | Freq: Every day | ORAL | 3 refills | Status: DC
Start: 2023-08-09 — End: 2024-06-11
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
## 2023-08-10 ENCOUNTER — Encounter: Payer: Self-pay | Admitting: Urology

## 2023-08-11 LAB — GC/CHLAMYDIA PROBE AMP
Chlamydia trachomatis, NAA: NEGATIVE
Neisseria Gonorrhoeae by PCR: NEGATIVE

## 2023-08-17 LAB — MYCOPLASMA / UREAPLASMA CULTURE
Mycoplasma hominis Culture: NEGATIVE
Ureaplasma urealyticum: NEGATIVE

## 2024-01-04 ENCOUNTER — Encounter: Payer: Self-pay | Admitting: Urology

## 2024-06-10 ENCOUNTER — Emergency Department: Payer: MEDICAID

## 2024-06-10 ENCOUNTER — Other Ambulatory Visit: Payer: Self-pay

## 2024-06-10 ENCOUNTER — Inpatient Hospital Stay: Payer: MEDICAID

## 2024-06-10 ENCOUNTER — Inpatient Hospital Stay
Admission: EM | Admit: 2024-06-10 | Discharge: 2024-06-11 | DRG: 272 | Disposition: A | Discharge status: Home / Self Care | Payer: MEDICAID | Attending: Emergency Medicine | Admitting: Emergency Medicine

## 2024-06-10 DIAGNOSIS — Z1152 Encounter for screening for COVID-19: Secondary | ICD-10-CM

## 2024-06-10 DIAGNOSIS — F419 Anxiety disorder, unspecified: Secondary | ICD-10-CM | POA: Diagnosis present

## 2024-06-10 DIAGNOSIS — Z7901 Long term (current) use of anticoagulants: Secondary | ICD-10-CM

## 2024-06-10 DIAGNOSIS — Z91048 Other nonmedicinal substance allergy status: Secondary | ICD-10-CM

## 2024-06-10 DIAGNOSIS — Z8249 Family history of ischemic heart disease and other diseases of the circulatory system: Secondary | ICD-10-CM

## 2024-06-10 DIAGNOSIS — Z79899 Other long term (current) drug therapy: Secondary | ICD-10-CM

## 2024-06-10 DIAGNOSIS — F411 Generalized anxiety disorder: Secondary | ICD-10-CM | POA: Diagnosis present

## 2024-06-10 DIAGNOSIS — Z818 Family history of other mental and behavioral disorders: Secondary | ICD-10-CM

## 2024-06-10 DIAGNOSIS — M79602 Pain in left arm: Secondary | ICD-10-CM

## 2024-06-10 DIAGNOSIS — Z720 Tobacco use: Secondary | ICD-10-CM | POA: Insufficient documentation

## 2024-06-10 DIAGNOSIS — F410 Panic disorder [episodic paroxysmal anxiety] without agoraphobia: Secondary | ICD-10-CM | POA: Diagnosis present

## 2024-06-10 DIAGNOSIS — I82622 Acute embolism and thrombosis of deep veins of left upper extremity: Secondary | ICD-10-CM | POA: Diagnosis present

## 2024-06-10 DIAGNOSIS — I82B12 Acute embolism and thrombosis of left subclavian vein: Principal | ICD-10-CM | POA: Diagnosis present

## 2024-06-10 DIAGNOSIS — F17210 Nicotine dependence, cigarettes, uncomplicated: Secondary | ICD-10-CM | POA: Diagnosis present

## 2024-06-10 DIAGNOSIS — J45990 Exercise induced bronchospasm: Secondary | ICD-10-CM | POA: Diagnosis present

## 2024-06-10 DIAGNOSIS — Z716 Tobacco abuse counseling: Secondary | ICD-10-CM

## 2024-06-10 DIAGNOSIS — F32A Depression, unspecified: Secondary | ICD-10-CM | POA: Diagnosis present

## 2024-06-10 DIAGNOSIS — I82A12 Acute embolism and thrombosis of left axillary vein: Principal | ICD-10-CM | POA: Diagnosis present

## 2024-06-10 DIAGNOSIS — Z23 Encounter for immunization: Secondary | ICD-10-CM

## 2024-06-10 LAB — CBC WITH DIFFERENTIAL/PLATELET
Abs Immature Granulocytes: 0.03 K/uL (ref 0.00–0.07)
Basophils Absolute: 0 K/uL (ref 0.0–0.1)
Basophils Relative: 0 %
Eosinophils Absolute: 0.2 K/uL (ref 0.0–0.5)
Eosinophils Relative: 2 %
HCT: 36.5 % — ABNORMAL LOW (ref 39.0–52.0)
Hemoglobin: 12.5 g/dL — ABNORMAL LOW (ref 13.0–17.0)
Immature Granulocytes: 0 %
Lymphocytes Relative: 16 %
Lymphs Abs: 1.4 K/uL (ref 0.7–4.0)
MCH: 30.6 pg (ref 26.0–34.0)
MCHC: 34.2 g/dL (ref 30.0–36.0)
MCV: 89.2 fL (ref 80.0–100.0)
Monocytes Absolute: 1 K/uL (ref 0.1–1.0)
Monocytes Relative: 11 %
Neutro Abs: 6.5 K/uL (ref 1.7–7.7)
Neutrophils Relative %: 71 %
Platelets: 208 K/uL (ref 150–400)
RBC: 4.09 MIL/uL — ABNORMAL LOW (ref 4.22–5.81)
RDW: 12.2 % (ref 11.5–15.5)
WBC: 9.1 K/uL (ref 4.0–10.5)
nRBC: 0 % (ref 0.0–0.2)

## 2024-06-10 LAB — BASIC METABOLIC PANEL WITH GFR
Anion gap: 7 (ref 5–15)
BUN: 13 mg/dL (ref 6–20)
CO2: 24 mmol/L (ref 22–32)
Calcium: 8.5 mg/dL — ABNORMAL LOW (ref 8.9–10.3)
Chloride: 104 mmol/L (ref 98–111)
Creatinine, Ser: 0.77 mg/dL (ref 0.61–1.24)
GFR, Estimated: >60 mL/min (ref >60)
Glucose, Bld: 88 mg/dL (ref 70–99)
Potassium: 4 mmol/L (ref 3.5–5.1)
Sodium: 136 mmol/L (ref 135–145)

## 2024-06-10 LAB — RESP PANEL BY RT-PCR (RSV, FLU A&B, COVID)  RVPGX2
Influenza A by PCR: NEGATIVE
Influenza B by PCR: NEGATIVE
Resp Syncytial Virus by PCR: NEGATIVE
SARS Coronavirus 2 by RT PCR: NEGATIVE

## 2024-06-10 LAB — HEPARIN LEVEL (UNFRACTIONATED): Heparin Unfractionated: 0.29 [IU]/mL — ABNORMAL LOW (ref 0.30–0.70)

## 2024-06-10 LAB — PROTIME-INR
INR: 1 (ref 0.8–1.2)
Prothrombin Time: 13.6 s (ref 11.4–15.2)

## 2024-06-10 LAB — HEPATIC FUNCTION PANEL
ALT: 82 U/L — ABNORMAL HIGH (ref 0–44)
AST: 42 U/L — ABNORMAL HIGH (ref 15–41)
Albumin: 3.8 g/dL (ref 3.5–5.0)
Alkaline Phosphatase: 122 U/L (ref 38–126)
Bilirubin, Direct: 0.2 mg/dL (ref 0.0–0.2)
Indirect Bilirubin: 0.2 mg/dL — ABNORMAL LOW (ref 0.3–0.9)
Total Bilirubin: 0.4 mg/dL (ref 0.0–1.2)
Total Protein: 6.3 g/dL — ABNORMAL LOW (ref 6.5–8.1)

## 2024-06-10 LAB — GROUP A STREP BY PCR: Group A Strep by PCR: NOT DETECTED

## 2024-06-10 LAB — APTT: aPTT: 30 s (ref 24–36)

## 2024-06-10 MED ORDER — ACETAMINOPHEN 650 MG RE SUPP: 650.0000 mg | Freq: Four times a day (QID) | RECTAL | Status: DC | PRN

## 2024-06-10 MED ORDER — ALBUTEROL SULFATE HFA 108 (90 BASE) MCG/ACT IN AERS: 2.0000 | INHALATION_SPRAY | Freq: Four times a day (QID) | RESPIRATORY_TRACT | Status: DC | PRN

## 2024-06-10 MED ORDER — HEPARIN (PORCINE) 25000 UT/250ML-% IV SOLN
1700.0000 [IU]/h | INTRAVENOUS | Status: DC
  Administered 2024-06-10: 1400 [IU]/h via INTRAVENOUS
  Administered 2024-06-11: 1550 [IU]/h via INTRAVENOUS
  Filled 2024-06-10 (×2): qty 250

## 2024-06-10 MED ORDER — SENNOSIDES-DOCUSATE SODIUM 8.6-50 MG PO TABS: 1.0000 | ORAL_TABLET | Freq: Every evening | ORAL | Status: DC | PRN

## 2024-06-10 MED ORDER — TAMSULOSIN HCL 0.4 MG PO CAPS
0.4000 mg | ORAL_CAPSULE | Freq: Every day | ORAL | Status: DC
  Administered 2024-06-11: 0.4 mg via ORAL
  Filled 2024-06-10: qty 1

## 2024-06-10 MED ORDER — INFLUENZA VIRUS VACC SPLIT PF (FLUZONE) 0.5 ML IM SUSY: 0.5000 mL | PREFILLED_SYRINGE | INTRAMUSCULAR | Status: DC

## 2024-06-10 MED ORDER — ACETAMINOPHEN 325 MG PO TABS
650.0000 mg | ORAL_TABLET | Freq: Four times a day (QID) | ORAL | Status: DC | PRN
  Administered 2024-06-10 – 2024-06-11 (×2): 650 mg via ORAL
  Filled 2024-06-10: qty 2

## 2024-06-10 MED ORDER — NICOTINE 21 MG/24HR TD PT24: 21.0000 mg | MEDICATED_PATCH | Freq: Every day | TRANSDERMAL | Status: DC | PRN

## 2024-06-10 MED ORDER — HEPARIN BOLUS VIA INFUSION
5800.0000 [IU] | Freq: Once | INTRAVENOUS | Status: AC
  Administered 2024-06-10: 5800 [IU] via INTRAVENOUS
  Filled 2024-06-10: qty 5800

## 2024-06-10 MED ORDER — ONDANSETRON HCL 4 MG PO TABS: 4.0000 mg | ORAL_TABLET | Freq: Four times a day (QID) | ORAL | Status: DC | PRN

## 2024-06-10 MED ORDER — LORAZEPAM 2 MG/ML IJ SOLN: 1.0000 mg | Freq: Four times a day (QID) | INTRAMUSCULAR | Status: DC | PRN

## 2024-06-10 MED ORDER — ONDANSETRON HCL 4 MG/2ML IJ SOLN: 4.0000 mg | Freq: Four times a day (QID) | INTRAMUSCULAR | Status: DC | PRN

## 2024-06-10 MED ORDER — MORPHINE SULFATE (PF) 4 MG/ML IV SOLN
4.0000 mg | INTRAVENOUS | Status: AC | PRN
  Administered 2024-06-11 (×3): 4 mg via INTRAVENOUS
  Filled 2024-06-10 (×3): qty 1

## 2024-06-10 MED ORDER — IOHEXOL 300 MG/ML  SOLN
75.0000 mL | Freq: Once | INTRAMUSCULAR | Status: AC | PRN
  Administered 2024-06-10: 75 mL via INTRAVENOUS

## 2024-06-10 MED ORDER — ALBUTEROL SULFATE (2.5 MG/3ML) 0.083% IN NEBU: 2.5000 mg | INHALATION_SOLUTION | Freq: Four times a day (QID) | RESPIRATORY_TRACT | Status: DC | PRN

## 2024-06-10 MED ORDER — MORPHINE SULFATE (PF) 2 MG/ML IV SOLN
2.0000 mg | INTRAVENOUS | Status: AC | PRN
  Administered 2024-06-10: 2 mg via INTRAVENOUS
  Filled 2024-06-10: qty 1
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
## 2024-06-10 NOTE — Assessment & Plan Note (Signed)
 PDMP reviewed Currently no active prescription Ativan 1 mg IV every 6 hours as needed for anxiety, 1 day ordered

## 2024-06-10 NOTE — Assessment & Plan Note (Addendum)
 Secondary to DVT in the left subclavian vein and left axillary vein Symptomatic support with acetaminophen , morphine  2 mg IV every 4 hours as needed for moderate pain, morphine  4 mg IV every 4 hours as needed for severe pain

## 2024-06-10 NOTE — Hospital Course (Signed)
 [name] is a 33 year old male with history of tobacco use disorder, history of anxiety, depression, asthma that is exercise-induced, rectal dysfunction, who presents to ED for chief concerns of 4 days of arm swelling.  Vitals in the ED showed t of 100.1, rr 16, hr 98, blood pressure 120/78, SpO2 100% on room air.  Serum sodium is 136, potassium 4.0, chloride 104, bicarb 24, BUN of 13, serum creatinine 0.77, eGFR greater than 60, nonfasting blood glucose 88, WBC 9.1, hemoglobin 12.5, platelet 208.  ED treatment: Heparin bolus and gtt.  EDP discussed with vascular, Dr. Tisa, who states they will see the patient.

## 2024-06-10 NOTE — ED Triage Notes (Signed)
 First nurse note: Pt to ED via POV from [facility]. Pt reports left arm pain and swelling started Wednesday.

## 2024-06-10 NOTE — Assessment & Plan Note (Addendum)
 Continue heparin GTT Vascular surgery has been consulted
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
## 2024-06-10 NOTE — Assessment & Plan Note (Signed)
 Not in acute exacerbation Albuterol  nebulizer 2.5 mg every 6 hours as needed for wheezing, shortness of breath

## 2024-06-10 NOTE — Assessment & Plan Note (Signed)
 Patient smokes 1 pack of cigarettes per day As needed nicotine patch ordered

## 2024-06-10 NOTE — ED Provider Notes (Signed)
 [facility] Provider Note    Event Date/Time   First MD Initiated Contact with Patient 06/10/24 1329     (approximate)   History   Arm Swelling   HPI  Brian Spence is a 33 y.o. male history of asthma, postconcussion syndrome, presents emergency department with swelling to the left arm and left shoulder.  Patient states that double the size of the right.  No recent IV.  Never IV use.  No known injury.  No shortness of breath.  Has had fever chills and bone pain frequently.  Denies chest pain.  Patient also has nasal congestion, cough, and low-grade fever      Physical Exam   Triage Vital Signs: ED Triage Vitals  Encounter Vitals Group     BP 06/10/24 1313 120/78     Girls Systolic BP Percentile --      Girls Diastolic BP Percentile --      Boys Systolic BP Percentile --      Boys Diastolic BP Percentile --      Pulse Rate 06/10/24 1313 98     Resp 06/10/24 1313 16     Temp 06/10/24 1313 100.1 F (37.8 C)     Temp Source 06/10/24 1313 Oral     SpO2 06/10/24 1313 100 %     Weight 06/10/24 1312 185 lb (83.9 kg)     Height 06/10/24 1312 6' (1.829 m)     Head Circumference --      Peak Flow --      Pain Score 06/10/24 1311 6     Pain Loc --      Pain Education --      Exclude from Growth Chart --     Most recent vital signs: Vitals:   06/10/24 1313  BP: 120/78  Pulse: 98  Resp: 16  Temp: 100.1 F (37.8 C)  SpO2: 100%     General: Awake, no distress.   CV:  Good peripheral perfusion. regular rate and  rhythm Resp:  Normal effort. Lungs cta Abd:  No distention.   Other:  Left shoulder with swelling noted across the anterior and down to the forearm, swelling causes arm to feel very tight, distal pulses intact, neurovascular intact, some swelling noted under the left axilla   ED Results / Procedures / Treatments   Labs (all labs ordered are listed, but only abnormal results are displayed) Labs Reviewed  BASIC METABOLIC PANEL  WITH GFR - Abnormal; Notable for the following components:      Result Value   Calcium 8.5 (*)    All other components within normal limits  CBC WITH DIFFERENTIAL/PLATELET - Abnormal; Notable for the following components:   RBC 4.09 (*)    Hemoglobin 12.5 (*)    HCT 36.5 (*)    All other components within normal limits  HEPATIC FUNCTION PANEL - Abnormal; Notable for the following components:   Total Protein 6.3 (*)    AST 42 (*)    ALT 82 (*)    Indirect Bilirubin 0.2 (*)    All other components within normal limits  RESP PANEL BY RT-PCR (RSV, FLU A&B, COVID)  RVPGX2  GROUP A STREP BY PCR  PROTIME-INR  APTT  MISC [facility] TEST (SEND OUT)     EKG     RADIOLOGY Chest x-ray Ultrasound left upper extremity    PROCEDURES:   .Critical Care  Performed by: Gasper Devere ORN, PA-C Authorized by: Gasper Devere ORN, PA-C  Critical care provider statement:    Critical care time (minutes):  30   Critical care time was exclusive of:  Separately billable procedures and treating other patients   Critical care was necessary to treat or prevent imminent or life-threatening deterioration of the following conditions:  Circulatory failure   Critical care was time spent personally by me on the following activities:  Development of treatment plan with patient or surrogate, discussions with consultants, evaluation of patient's response to treatment, examination of patient, ordering and review of laboratory studies, ordering and review of radiographic studies, ordering and performing treatments and interventions, pulse oximetry, re-evaluation of patient's condition, review of old charts and obtaining history from patient or surrogate   Care discussed with: admitting provider     Critical Care:  yes Chief Complaint  Patient presents with   Arm Swelling      MEDICATIONS ORDERED IN ED: Medications  heparin ADULT infusion 100 units/mL (25000 units/250mL) (1,400 Units/hr Intravenous New  Bag/Given 06/10/24 1638)  tamsulosin  (FLOMAX ) capsule 0.4 mg (has no administration in time range)  acetaminophen  (TYLENOL ) tablet 650 mg (has no administration in time range)    Or  acetaminophen  (TYLENOL ) suppository 650 mg (has no administration in time range)  ondansetron  (ZOFRAN ) tablet 4 mg (has no administration in time range)    Or  ondansetron  (ZOFRAN ) injection 4 mg (has no administration in time range)  senna-docusate (Senokot-S) tablet 1 tablet (has no administration in time range)  albuterol  (PROVENTIL ) (2.5 MG/3ML) 0.083% nebulizer solution 2.5 mg (has no administration in time range)  heparin bolus via infusion 5,800 Units (5,800 Units Intravenous Bolus from Bag 06/10/24 1638)     IMPRESSION / MDM / ASSESSMENT AND PLAN / ED COURSE  I reviewed the triage vital signs and the nursing notes.                              Differential diagnosis includes, but is not limited to, DVT, lymphoma, PE, bicep tendon injury, rotator cuff tear, URI, COVID, influenza  Patient's presentation is most consistent with acute illness / injury with system symptoms.   Medications given: Heparin  Due to the amount of swelling and concerns for DVT, we will go ahead and do ultrasound left upper extremity, chest x-ray ordered from triage   Chest x-ray independently reviewed interpreted by me as being negative for acute abnormality, confirmed by radiology  Labs ordered, labs reassuring  Ultrasound left upper extremity for DVT, I did independently review the images, spoke with radiology concerning images, Dr. Dwane radiologist states he has an occlusive DVT of the left subclavian and axillary vein.  Consult to vascular, spoke with Dr. Tisa, states admit, start heparin, she will still consult tomorrow for possible thrombectomy  Consult hospitalist, Dr. Sherre to admit  Patient agreement with admission, heparin to be started, he is in stable condition at time of admission   FINAL CLINICAL  IMPRESSION(S) / ED DIAGNOSES   Final diagnoses:  Acute deep vein thrombosis (DVT) of axillary vein of left upper extremity (HCC)  Occlusion of left subclavian vein (HCC)     Rx / DC Orders   ED Discharge Orders     None        Note:  This document was prepared using Dragon voice recognition software and may include unintentional dictation errors.    Gasper Devere ORN, PA-C 06/10/24 1658    Waymond Lorelle Cummins, MD 06/10/24 1700
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
## 2024-06-10 NOTE — ED Notes (Signed)
 Informed RN bed assigned

## 2024-06-10 NOTE — ED Triage Notes (Signed)
 Pt to ED from [facility] for L upper and lower arm swelling since 4 days ago. Sometimes hand feels cold. L upper arm is markedly swollen compared to R. Sent for u/s for DVT rule out. Pt is current smoker. Counseled to quit. Respirations are unlabored.

## 2024-06-10 NOTE — Plan of Care (Signed)
 ?  Problem: Clinical Measurements: ?Goal: Will remain free from infection ?Outcome: Progressing ?  ?

## 2024-06-11 ENCOUNTER — Encounter
Admission: EM | Disposition: A | Discharge status: Home / Self Care | Payer: Self-pay | Source: Home / Self Care | Attending: Internal Medicine

## 2024-06-11 ENCOUNTER — Other Ambulatory Visit (INDEPENDENT_AMBULATORY_CARE_PROVIDER_SITE_OTHER): Payer: Self-pay | Admitting: Nurse Practitioner

## 2024-06-11 ENCOUNTER — Encounter: Payer: Self-pay | Admitting: Vascular Surgery

## 2024-06-11 ENCOUNTER — Other Ambulatory Visit (HOSPITAL_COMMUNITY): Payer: Self-pay

## 2024-06-11 ENCOUNTER — Other Ambulatory Visit: Payer: Self-pay

## 2024-06-11 DIAGNOSIS — G540 Brachial plexus disorders: Secondary | ICD-10-CM

## 2024-06-11 DIAGNOSIS — I871 Compression of vein: Secondary | ICD-10-CM

## 2024-06-11 DIAGNOSIS — I82B12 Acute embolism and thrombosis of left subclavian vein: Principal | ICD-10-CM

## 2024-06-11 HISTORY — PX: PERIPHERAL VASCULAR THROMBECTOMY: CATH118306

## 2024-06-11 LAB — CBC
HCT: 35.8 % — ABNORMAL LOW (ref 39.0–52.0)
Hemoglobin: 12.1 g/dL — ABNORMAL LOW (ref 13.0–17.0)
MCH: 30.5 pg (ref 26.0–34.0)
MCHC: 33.8 g/dL (ref 30.0–36.0)
MCV: 90.2 fL (ref 80.0–100.0)
Platelets: 207 K/uL (ref 150–400)
RBC: 3.97 MIL/uL — ABNORMAL LOW (ref 4.22–5.81)
RDW: 12.4 % (ref 11.5–15.5)
WBC: 7.5 K/uL (ref 4.0–10.5)
nRBC: 0 % (ref 0.0–0.2)

## 2024-06-11 LAB — BASIC METABOLIC PANEL WITH GFR
Anion gap: 8 (ref 5–15)
BUN: 16 mg/dL (ref 6–20)
CO2: 25 mmol/L (ref 22–32)
Calcium: 8.4 mg/dL — ABNORMAL LOW (ref 8.9–10.3)
Chloride: 105 mmol/L (ref 98–111)
Creatinine, Ser: 0.89 mg/dL (ref 0.61–1.24)
GFR, Estimated: >60 mL/min (ref >60)
Glucose, Bld: 102 mg/dL — ABNORMAL HIGH (ref 70–99)
Potassium: 3.9 mmol/L (ref 3.5–5.1)
Sodium: 138 mmol/L (ref 135–145)

## 2024-06-11 LAB — HEPARIN LEVEL (UNFRACTIONATED): Heparin Unfractionated: 0.29 [IU]/mL — ABNORMAL LOW (ref 0.30–0.70)

## 2024-06-11 SURGERY — PERIPHERAL VASCULAR THROMBECTOMY
Anesthesia: Moderate Sedation | Laterality: Left

## 2024-06-11 MED ORDER — FENTANYL CITRATE (PF) 100 MCG/2ML IJ SOLN
INTRAMUSCULAR | Status: AC
  Filled 2024-06-11: qty 2

## 2024-06-11 MED ORDER — APIXABAN 5 MG PO TABS
ORAL_TABLET | ORAL | 0 refills | Status: AC
  Filled 2024-06-11: qty 74, 30d supply, fill #0
  Filled 2024-07-03 (×2): qty 60, 30d supply, fill #1

## 2024-06-11 MED ORDER — APIXABAN 5 MG PO TABS: 5.0000 mg | ORAL_TABLET | Freq: Two times a day (BID) | ORAL | Status: DC

## 2024-06-11 MED ORDER — HEPARIN (PORCINE) IN NACL 1000-0.9 UT/500ML-% IV SOLN
INTRAVENOUS | Status: DC | PRN
  Administered 2024-06-11: 1000 mL

## 2024-06-11 MED ORDER — FENTANYL CITRATE (PF) 100 MCG/2ML IJ SOLN
INTRAMUSCULAR | Status: DC | PRN
  Administered 2024-06-11 (×2): 50 ug via INTRAVENOUS

## 2024-06-11 MED ORDER — CEFAZOLIN SODIUM-DEXTROSE 2-4 GM/100ML-% IV SOLN
2.0000 g | INTRAVENOUS | Status: AC
  Administered 2024-06-11: 2 g via INTRAVENOUS

## 2024-06-11 MED ORDER — HEPARIN SODIUM (PORCINE) 1000 UNIT/ML IJ SOLN
INTRAMUSCULAR | Status: AC
  Filled 2024-06-11: qty 10

## 2024-06-11 MED ORDER — FAMOTIDINE 20 MG PO TABS: 40.0000 mg | ORAL_TABLET | Freq: Once | ORAL | Status: DC | PRN

## 2024-06-11 MED ORDER — LIDOCAINE-EPINEPHRINE (PF) 1 %-1:200000 IJ SOLN
INTRAMUSCULAR | Status: DC | PRN
  Administered 2024-06-11: 10 mL

## 2024-06-11 MED ORDER — OXYCODONE HCL 5 MG PO TABS
5.0000 mg | ORAL_TABLET | Freq: Three times a day (TID) | ORAL | 0 refills | Status: AC | PRN
Start: 2024-06-11 — End: 2025-06-11
  Filled 2024-06-11: qty 20, 7d supply, fill #0

## 2024-06-11 MED ORDER — SODIUM CHLORIDE 0.9 % IV SOLN: INTRAVENOUS | Status: DC

## 2024-06-11 MED ORDER — HEPARIN BOLUS VIA INFUSION
1200.0000 [IU] | Freq: Once | INTRAVENOUS | Status: AC
  Administered 2024-06-11: 1200 [IU] via INTRAVENOUS
  Filled 2024-06-11: qty 1200

## 2024-06-11 MED ORDER — MIDAZOLAM HCL 2 MG/ML PO SYRP: 8.0000 mg | ORAL_SOLUTION | Freq: Once | ORAL | Status: DC | PRN

## 2024-06-11 MED ORDER — METHYLPREDNISOLONE SODIUM SUCC 125 MG IJ SOLR: 125.0000 mg | Freq: Once | INTRAMUSCULAR | Status: DC | PRN

## 2024-06-11 MED ORDER — MIDAZOLAM HCL 5 MG/5ML IJ SOLN
INTRAMUSCULAR | Status: AC
  Filled 2024-06-11: qty 5

## 2024-06-11 MED ORDER — HEPARIN SODIUM (PORCINE) 1000 UNIT/ML IJ SOLN
INTRAMUSCULAR | Status: DC | PRN
  Administered 2024-06-11: 3000 [IU] via INTRAVENOUS

## 2024-06-11 MED ORDER — DIPHENHYDRAMINE HCL 50 MG/ML IJ SOLN: 50.0000 mg | Freq: Once | INTRAMUSCULAR | Status: DC | PRN

## 2024-06-11 MED ORDER — IODIXANOL 320 MG/ML IV SOLN
INTRAVENOUS | Status: DC | PRN
  Administered 2024-06-11: 40 mL

## 2024-06-11 MED ORDER — CEFAZOLIN SODIUM-DEXTROSE 2-4 GM/100ML-% IV SOLN
INTRAVENOUS | Status: AC
Start: 2024-06-11 — End: 2024-06-11
  Filled 2024-06-11: qty 100

## 2024-06-11 MED ORDER — MIDAZOLAM HCL (PF) 2 MG/2ML IJ SOLN
INTRAMUSCULAR | Status: DC | PRN
  Administered 2024-06-11 (×3): 1 mg via INTRAVENOUS
  Administered 2024-06-11: 2 mg via INTRAVENOUS

## 2024-06-11 MED ORDER — APIXABAN 5 MG PO TABS
10.0000 mg | ORAL_TABLET | Freq: Two times a day (BID) | ORAL | Status: DC
  Administered 2024-06-11: 10 mg via ORAL
  Filled 2024-06-11: qty 2

## 2024-06-11 SURGICAL SUPPLY — 15 items
BALLOON ARMADA 10X80X80 (BALLOONS) IMPLANT
CANISTER PENUMBRA ENGINE (MISCELLANEOUS) IMPLANT
CATH BEACON 5 .035 65 KMP TIP (CATHETERS) IMPLANT
CATH LIGHTING BOLT 12 XT 100 (CATHETERS) IMPLANT
COVER PROBE ULTRASOUND 5X96 (MISCELLANEOUS) IMPLANT
DEVICE PRESTO INFLATION (MISCELLANEOUS) IMPLANT
DRAPE BRACHIAL (DRAPES) IMPLANT
GLIDEWIRE ADV .035X260CM (WIRE) IMPLANT
INTRODUCER PERFORM 12FR X13 (SHEATH) IMPLANT
KIT MICROPUNCTURE VSI 5F STIFF (SHEATH) IMPLANT
NDL ENTRY 21GA 7CM ECHOTIP (NEEDLE) IMPLANT
NEEDLE ENTRY 21GA 7CM ECHOTIP (NEEDLE) IMPLANT
PACK ANGIOGRAPHY (CUSTOM PROCEDURE TRAY) ×1 IMPLANT
SHEATH BRITE TIP 6FRX5.5 (SHEATH) IMPLANT
SUT MNCRL AB 4-0 PS2 18 (SUTURE) IMPLANT

## 2024-06-11 NOTE — Discharge Instructions (Signed)
 Affordable Care Act (ACA) Marketplace Information  The Affordable Care Act Marketplace is a federal program that helps people get health insurance. It offers private health insurance plans that meet national standards for coverage. Consumers can compare plans, check eligibility for financial help, and enroll in coverage through the Marketplace.  The Marketplace provides financial assistance based on income. This includes premium tax credits to lower monthly insurance costs and cost sharing reductions that lower deductibles and copayments. Eligibility for financial help depends on household size, income, and tax filing status.  Marketplace plans are divided into metal levels: Bronze, Silver, Gold, and Platinum. All plans cover essential health benefits such as doctor visits, hospital care, prescriptions, maternity care, mental health services, and preventive care.  Most people can enroll during Open Enrollment, which typically runs from November 1 through January 15. Individuals may also qualify for a Special Enrollment Period if they experience a qualifying life event such as loss of coverage, marriage, birth of a child, or moving to a new area.  People can apply online at [email or web address] by phone at [phone], or with the help of a certified assister or insurance agent. Coverage through the Marketplace is not based on health status or preexisting conditions.

## 2024-06-11 NOTE — Progress Notes (Signed)
 Progress Note    06/11/2024 8:17 AM * No surgery found *  Subjective:  Brian Spence is a 33 yo male who presents to [facility] with left upper extremity swelling which got progressively worse over the past week. Patient endorses he has had on and off swelling for the past year. Endorses pain today.   Upon work up noted to have a left subclavia and axilla DVT. Patient was started on heparin and continues on infusion. No complaints and vitals all remain stable.    Vitals:   06/11/24 0417 06/11/24 0502  BP: 111/65 (!) 111/55  Pulse: 75 (!) 55  Resp: 15 16  Temp: (!) 102 F (38.9 C) 99.2 F (37.3 C)  SpO2: 96% 94%   Physical Exam: Cardiac:  RRR, normal S1, S2. No murmurs Lungs:  Clear throughout on auscultation, non labored, no rales rhonchi or wheezing.  Incisions:  None  Extremities:  All extremity with palpable pulses and warm to touch. Left upper extremity with +2 edema this morning.  Abdomen:  Positive bowel sounds throughout, soft, non tender and non distended.  Neurologic: AAOX 3 answers all questions and follows commands.   CBC    Component Value Date/Time   WBC 7.5 06/11/2024 0516   RBC 3.97 (L) 06/11/2024 0516   HGB 12.1 (L) 06/11/2024 0516   HCT 35.8 (L) 06/11/2024 0516   HCT 41 08/21/2010 0000   PLT 207 06/11/2024 0516   MCV 90.2 06/11/2024 0516   MCH 30.5 06/11/2024 0516   MCHC 33.8 06/11/2024 0516   RDW 12.4 06/11/2024 0516   LYMPHSABS 1.4 06/10/2024 1403   MONOABS 1.0 06/10/2024 1403   EOSABS 0.2 06/10/2024 1403   BASOSABS 0.0 06/10/2024 1403    BMET    Component Value Date/Time   NA 138 06/11/2024 0516   NA 138 08/21/2010 0000   K 3.9 06/11/2024 0516   K 4.0 08/21/2010 0000   CL 105 06/11/2024 0516   CL 103 08/21/2010 0000   CO2 25 06/11/2024 0516   CO2 22 08/21/2010 0000   GLUCOSE 102 (H) 06/11/2024 0516   BUN 16 06/11/2024 0516   BUN 13 08/21/2010 0000   CREATININE 0.89 06/11/2024 0516   CREATININE 0.78 08/21/2010 0000   CALCIUM 8.4 (L)  06/11/2024 0516   CALCIUM 9.2 08/21/2010 0000   GFRNONAA >60 06/11/2024 0516   GFRAA >60 08/04/2017 1418    INR    Component Value Date/Time   INR 1.0 06/10/2024 1403     Intake/Output Summary (Last 24 hours) at 06/11/2024 0817 Last data filed at 06/11/2024 0412 Gross per 24 hour  Intake 233.35 ml  Output --  Net 233.35 ml     Assessment/Plan:  33 y.o. male presents to [facility] emergency room with left upper extremity swelling +2 for about a week. Swelling and pain worsened over the past 2 days. Upon work up patient noted to have a left subclavia and axillary DVT.  * No surgery found *   Vascular surgery plans on taking the patient to the vascular lab for left upper extremity thrombectomy with possible intervention later today.  I do long detailed discussion at the bedside this morning with the patient and his mother and father concerning the procedure, benefits, risk, and complications.  We also discussed in detail his need for long-term anticoagulation for at a minimum 6 months to possibly a year.  They verbalized her understanding and wished to proceed with the procedure.  I answered all her questions this morning.  Patient's been n.p.o. since midnight last night.  The patient remains on heparin infusion until the procedure.  Patient's labs are all within normal limits.  I discussed the case in detail with Dr. Selinda Gu MD and he agrees with plan.  DVT prophylaxis: Heparin infusion   Gwendlyn JONELLE Shank [facility] 06/11/2024 8:17 AM

## 2024-06-11 NOTE — Op Note (Signed)
 [place] [facility]   OPERATIVE NOTE   PRE-OPERATIVE DIAGNOSIS: extensive left upper extremity DVT  POST-OPERATIVE DIAGNOSIS: same   PROCEDURE: 1.   US  guidance for vascular access to left basilic vein 2.   Catheter placement into left innominate vein from left basilic approach  3.   SVC gram and left upper extremity venogram 4.   Mechanical thrombectomy to left axillary and subclavian veins with the penumbra 12 bolt device 5.   PTA of left subclavian and junction of the subclavian to the innominate vein with 10 mm balloon    SURGEON: Selinda Gu, MD  ASSISTANT(S): none  ANESTHESIA: local with moderate conscious sedation for 37 minutes using 3 mg of Versed and 100 mcg of Fentanyl  ESTIMATED BLOOD LOSS: 200 cc  FINDING(S): 1.  Left axillary subclavian vein thrombosis with clear narrowing of the subclavian vein proximally near where it joins the innominate vein of greater than 80% uncovered after thrombectomy.  Innominate vein and SVC were patent.  SPECIMEN(S):  none  INDICATIONS:    Patient is a 33 y.o. male who presents with extensive left upper extremity DVT.  Patient has marked arm swelling and pain.  Venous intervention is performed to reduce the symtpoms and avoid long term postphlebitic symptoms.    DESCRIPTION: After obtaining full informed written consent, the patient was brought back to the vascular suite and placed supine upon the table. Moderate conscious sedation was administered during a face to face encounter with the patient throughout the procedure with my supervision of the RN administering medicines and monitoring the patient's vital signs, pulse oximetry, telemetry and mental status throughout from the start of the procedure until the patient was taken to the recovery room.  After obtaining adequate anesthesia, the patient was prepped and draped in the standard fashion.    The basilic vein was visualized with ultrasound.  It appeared to be patent.   It was fairly large.  It was then accessed under direct ultrasound guidance without difficulty with a micropuncture needle.  A micropuncture wire and sheath were placed and then I upsized to a 6 French sheath.  A permanent image was recorded.  Imaging initially through the 6 French sheath showed the basilic and brachial veins appear to be patent but flow stopped in the axillary vein consistent with thrombotic occlusion.  I then used an advantage wire and a Kumpe catheter and easily cross the thrombotic occlusion in the axillary and subclavian veins and got into the left innominate vein.  Imaging in the left innominate vein showed there to be restored patency in the innominate vein and the superior vena cava was widely patent.  I then gave IV heparin and removed the diagnostic catheter.  The penumbra 12 bolt catheter was then brought onto the field and 3 passes were made over the advantage wire in the left axillary and subclavian veins.  Significant thrombus was removed.  The wire was removed to allow more torque ability of the catheter and 2 more passes of mechanical thrombectomy were performed with the penumbra 12 bolt catheter in the left axillary and subclavian veins.  Following this, there was near resolution of the thrombus, but this had uncovered a high-grade stenosis of greater than 80% in the proximal left subclavian vein near where it joins the innominate vein.  I then replaced the advantage wire and crossed the lesion.  A 10 mm diameter angioplasty balloon was then inflated to 4 atm for 1 minute.  Imaging following this showed  residual narrowing of greater than 50% still in this location, but as I think you will likely need a first rib resection for thoracic outlet syndrome, I did not want to place a stent in this location.  There was now inline flow and near resolution of the thrombus.  I elected to terminate the procedure.  The sheath was removed, the Pro-glide device was secured and a 4-0 Monocryl was  placed at the skin incision and a dressing was placed.  Patient was taken to the recovery room in stable condition having tolerated the procedure well.    COMPLICATIONS: None  CONDITION: Stable  Selinda Gu 06/11/2024 2:57 PM

## 2024-06-11 NOTE — Discharge Summary (Signed)
 Physician Discharge Summary  Brian Spence FMW:[id number] DOB: [birth year] DOA: 06/10/2024  PCP: Tobie Domino, MD  Admit date: 06/10/2024 Discharge date: 06/11/2024  Admitted From: Home Disposition:  Home  Recommendations for Outpatient Follow-up:  Follow up with PCP in 1-2 weeks Follow up with vascular surgery at [facility] as outpatient  Home Health:No  Equipment/Devices:None   Discharge Condition:Stable  CODE STATUS:FULL  Diet recommendation: Reg  Brief/Interim Summary: 33 year old male with history of tobacco use disorder, history of anxiety, depression, asthma that is exercise-induced, who presents to ED for chief concerns of 4 days of arm swelling. He reports he has had swelling of his left upper extremity that started about 4 days ago. He denies known trauma to his person.   Upper extremity ultrasound revealed left subclavian and axillary DVTs.  Vascular surgery engaged.  CT ordered per their recommendations.  Patient was taken for thrombectomy on 11/17.  Tolerated procedure well.  Case discussed with vascular surgery.  Cleared for discharge on oral anticoagulation from their standpoint.  Patient will need to follow-up with [facility] vascular surgery as outpatient for continued observation and consideration for repeat procedure.  Patient advised on smoking cessation.  Offered nicotine patches.  Patient declined.      Discharge Diagnoses:  Principal Problem:   Acute deep vein thrombosis (DVT) of left upper extremity (HCC) Active Problems:   Pain and swelling of left upper extremity   Anxiety and depression   Generalized anxiety disorder   Exercise-induced asthma   Panic attacks   Tobacco use   Occlusion of left subclavian vein (HCC)  Left upper extremity subclavian and axillary DVT Etiology is not entirely clear.  Patient underwent mechanical thrombectomy with vascular surgery.  Tolerated procedure well.  No postoperative complications.  Cleared for discharge by vascular  surgery.  Discontinue heparin GTT.  Start p.o. Eliquis 10 mg twice daily x 7 days followed by 5 mg twice daily for at least 3 to 6 months.  49-month supply provided at time of discharge.  Patient will need to follow-up with [facility] vascular surgery as outpatient.  Discharge Instructions  Discharge Instructions     Diet - low sodium heart healthy   Complete by: As directed    Increase activity slowly   Complete by: As directed       Allergies as of 06/11/2024       Reactions   Lyrica [pregabalin]    Chest pain   Zoloft  [sertraline  Hcl] Other (See Comments)   Sharp headache and dizziness        Medication List     STOP taking these medications    tamsulosin  0.4 MG Caps capsule Commonly known as: FLOMAX        TAKE these medications    albuterol  108 (90 Base) MCG/ACT inhaler Commonly known as: VENTOLIN  HFA Inhale 2 puffs into the lungs every 6 (six) hours as needed for wheezing.   apixaban 5 MG Tabs tablet Commonly known as: ELIQUIS Take 2 tablets (10 mg total) by mouth 2 (two) times daily for 7 days, THEN 1 tablet (5 mg total) 2 (two) times daily. Start taking on: June 11, 2024   fluticasone  50 MCG/ACT nasal spray Commonly known as: FLONASE  Place 2 sprays into the nose daily as needed for allergies.   meloxicam  7.5 MG tablet Commonly known as: MOBIC  Take 7.5 mg by mouth 2 (two) times daily.   oxyCODONE 5 MG immediate release tablet Commonly known as: Roxicodone Take 1 tablet (5 mg total) by mouth every 8 (  eight) hours as needed for severe pain (pain score 7-10).        Allergies  Allergen Reactions   Lyrica [Pregabalin]     Chest pain   Zoloft  [Sertraline  Hcl] Other (See Comments)    Sharp headache and dizziness    Consultations: Vascular surgery   Procedures/Studies: PERIPHERAL VASCULAR CATHETERIZATION Result Date: 06/11/2024 See surgical note for result.  CT CHEST W CONTRAST Result Date: 06/10/2024 EXAM: CT CHEST WITH CONTRAST 06/10/2024  07:14:14 PM TECHNIQUE: CT of the chest was performed with the administration of intravenous contrast. 75 mL of iohexol  (OMNIPAQUE ) 300 MG/ML solution was administered. Multiplanar reformatted images are provided for review. Automated exposure control, iterative reconstruction, and/or weight based adjustment of the mA/kV was utilized to reduce the radiation dose to as low as reasonably achievable. COMPARISON: Left upper extremity venous doppler ultrasound examination 06/10/2024. CLINICAL HISTORY: Thoracic outlet syndrome, venous. The patient has left arm pain and swelling and recent ultrasound demonstrated left subclavian and left axillary venous thrombosis. FINDINGS: MEDIASTINUM: The heart is normal in size. No pericardial effusion. The aorta is normal in caliber. No atherosclerotic calcifications. The pulmonary arteries are grossly normal. The central airways are clear. There is soft tissue density in the anterior mediastinum which has a typical upside down v configuration and has a marbled fatty appearance. This is consistent with residual thymic tissue in this young patient. The esophagus is unremarkable. LYMPH NODES: Small scattered mediastinal and hilar lymph nodes but no mass or overt adenopathy. There are mildly enlarged left axillary lymph nodes, which are likely reactive. LUNGS AND PLEURA: Dependent bibasilar atelectasis noted. No infiltrates or pulmonary edema. No worrisome pulmonary lesions. There is a 5 mm nodule in the right middle lobe which is likely benign. However, patient is a smoker and should probably have a follow-up chest CT in 6-12 months to make sure this is stable. No pleural effusion or pneumothorax. SOFT TISSUES/BONES: No chest wall mass, supraclavicular adenopathy or muscle mass or hematoma. This is not a dedicated venous study but the left subclavian and axillary veins are enlarged, ill-defined, and demonstrate hazy interstitial changes around them consistent with deep vein thrombosis  (DVT) that was seen on the ultrasound examination. The subclavian artery and axillary artery are normal. Do not see any significant bony abnormality that could be causing thoracic outlet syndrome. No cervical ribs are identified. No anomalous muscles are seen. UPPER ABDOMEN: Limited images of the upper abdomen demonstrates no acute abnormality. The upper abdomen is unremarkable. IMPRESSION: 1. Left subclavian and axillary vein thrombosis, consistent with recent ultrasound findings. 2. Solitary 5 mm solid right middle lobe pulmonary nodule in a smoker; per Fleischner Society Guidelines for a 05 mm nodule with high-risk features, optional non-contrast chest CT at 12 months. If stable at 12 months, no further follow-up. Electronically signed by: Maude Stammer MD 06/10/2024 10:00 PM EST RP Workstation: [id number]   US  Venous Img Upper Uni Left Result Date: 06/10/2024 CLINICAL DATA:  Left arm pain and swelling. EXAM: LEFT UPPER EXTREMITY VENOUS DOPPLER ULTRASOUND TECHNIQUE: Gray-scale sonography with graded compression, as well as color Doppler and duplex ultrasound were performed to evaluate the upper extremity deep venous system from the level of the subclavian vein and including the jugular, axillary, basilic, radial, ulnar and upper cephalic vein. Spectral Doppler was utilized to evaluate flow at rest and with distal augmentation maneuvers. COMPARISON:  None Available. FINDINGS: Contralateral Subclavian Vein: Respiratory phasicity is normal and symmetric with the symptomatic side. No evidence of thrombus. Normal compressibility. Internal  Jugular Vein: No evidence of thrombus. Normal compressibility, respiratory phasicity and response to augmentation. Subclavian Vein: There is evidence of occlusive thrombus with abnormal compressibility, respiratory phasicity and response to augmentation. Axillary Vein: There is evidence of occlusive thrombus with abnormal compressibility, respiratory phasicity and response to  augmentation. Cephalic Vein: No evidence of thrombus. Normal compressibility, respiratory phasicity and response to augmentation. Basilic Vein: No evidence of thrombus. Normal compressibility, respiratory phasicity and response to augmentation. Brachial Veins: No evidence of thrombus. Normal compressibility, respiratory phasicity and response to augmentation. Radial Veins: No evidence of thrombus. Normal compressibility, respiratory phasicity and response to augmentation. Ulnar Veins: No evidence of thrombus. Normal compressibility, respiratory phasicity and response to augmentation. Venous Reflux:  None visualized. Other Findings:  None visualized. IMPRESSION: Evidence of occlusive DVT within the LEFT subclavian vein and LEFT axillary vein. Electronically Signed   By: Suzen Dials M.D.   On: 06/10/2024 15:40   DG Chest 1 View Result Date: 06/10/2024 CLINICAL DATA:  Flu-like symptoms.  Left arm swelling. EXAM: CHEST  1 VIEW COMPARISON:  Radiographs 08/21/2013 FINDINGS: 1327 hours. The heart size and mediastinal contours are normal. The lungs are clear. There is no pleural effusion or pneumothorax. No acute osseous findings are identified. Possible previous distal right clavicle resection. IMPRESSION: No evidence of active cardiopulmonary process. Electronically Signed   By: Elsie Perone M.D.   On: 06/10/2024 13:56      Subjective: Seen and examined on the day of discharge.  Stable no distress.  Appropriate discharge home.  Discharge Exam: Vitals:   06/11/24 1530 06/11/24 1552  BP: 117/73 110/62  Pulse: 87 82  Resp: (!) 23 18  Temp:  99.1 F (37.3 C)  SpO2: 95% 98%   Vitals:   06/11/24 1500 06/11/24 1515 06/11/24 1530 06/11/24 1552  BP: 121/75 124/72 117/73 110/62  Pulse: 95 92 87 82  Resp:  18 (!) 23 18  Temp:    99.1 F (37.3 C)  TempSrc:    Oral  SpO2: 99% 97% 95% 98%  Weight:      Height:        General: Pt is alert, awake, not in acute distress Cardiovascular: RRR,  S1/S2 +, no rubs, no gallops Respiratory: CTA bilaterally, no wheezing, no rhonchi Abdominal: Soft, NT, ND, bowel sounds + Extremities: no edema, no cyanosis    The results of significant diagnostics from this hospitalization (including imaging, microbiology, ancillary and laboratory) are listed below for reference.     Microbiology: Recent Results (from the past 240 hours)  Resp panel by RT-PCR (RSV, Flu A&B, Covid) Anterior Nasal Swab     Status: None   Collection Time: 06/10/24  2:03 PM   Specimen: Anterior Nasal Swab  Result Value Ref Range Status   SARS Coronavirus 2 by RT PCR NEGATIVE NEGATIVE Final    Comment: (NOTE) SARS-CoV-2 target nucleic acids are NOT DETECTED.  The SARS-CoV-2 RNA is generally detectable in upper respiratory specimens during the acute phase of infection. The lowest concentration of SARS-CoV-2 viral copies this assay can detect is 138 copies/mL. A negative result does not preclude SARS-Cov-2 infection and should not be used as the sole basis for treatment or other patient management decisions. A negative result may occur with  improper specimen collection/handling, submission of specimen other than nasopharyngeal swab, presence of viral mutation(s) within the areas targeted by this assay, and inadequate number of viral copies(<138 copies/mL). A negative result must be combined with clinical observations, patient history, and epidemiological information. The  expected result is Negative.  Fact Sheet for Patients:  bloggercourse.com  Fact Sheet for Healthcare Providers:  seriousbroker.it  This test is no t yet approved or cleared by the [place]  FDA and  has been authorized for detection and/or diagnosis of SARS-CoV-2 by FDA under an Emergency Use Authorization (EUA). This EUA will remain  in effect (meaning this test can be used) for the duration of the COVID-19 declaration under Section  564(b)(1) of the Act, 21 U.S.C.section 360bbb-3(b)(1), unless the authorization is terminated  or revoked sooner.       Influenza A by PCR NEGATIVE NEGATIVE Final   Influenza B by PCR NEGATIVE NEGATIVE Final    Comment: (NOTE) The Xpert Xpress SARS-CoV-2/FLU/RSV plus assay is intended as an aid in the diagnosis of influenza from Nasopharyngeal swab specimens and should not be used as a sole basis for treatment. Nasal washings and aspirates are unacceptable for Xpert Xpress SARS-CoV-2/FLU/RSV testing.  Fact Sheet for Patients: bloggercourse.com  Fact Sheet for Healthcare Providers: seriousbroker.it  This test is not yet approved or cleared by the [place]  FDA and has been authorized for detection and/or diagnosis of SARS-CoV-2 by FDA under an Emergency Use Authorization (EUA). This EUA will remain in effect (meaning this test can be used) for the duration of the COVID-19 declaration under Section 564(b)(1) of the Act, 21 U.S.C. section 360bbb-3(b)(1), unless the authorization is terminated or revoked.     Resp Syncytial Virus by PCR NEGATIVE NEGATIVE Final    Comment: (NOTE) Fact Sheet for Patients: bloggercourse.com  Fact Sheet for Healthcare Providers: seriousbroker.it  This test is not yet approved or cleared by the [place]  FDA and has been authorized for detection and/or diagnosis of SARS-CoV-2 by FDA under an Emergency Use Authorization (EUA). This EUA will remain in effect (meaning this test can be used) for the duration of the COVID-19 declaration under Section 564(b)(1) of the Act, 21 U.S.C. section 360bbb-3(b)(1), unless the authorization is terminated or revoked.  Performed at [facility], [address] [place], [place] [address]   Group A Strep by PCR [facility] Only)     Status: None   Collection Time: 06/10/24  2:03 PM   Specimen: Anterior  Nasal Swab; Sterile Swab  Result Value Ref Range Status   Group A Strep by PCR NOT DETECTED NOT DETECTED Final    Comment: Performed at [facility], [address] [place], [place] [address]     Labs: BNP (last 3 results) No results for input(s): BNP in the last 8760 hours. Basic Metabolic Panel: Recent Labs  Lab 06/10/24 1403 06/11/24 0516  NA 136 138  K 4.0 3.9  CL 104 105  CO2 24 25  GLUCOSE 88 102*  BUN 13 16  CREATININE 0.77 0.89  CALCIUM 8.5* 8.4*   Liver Function Tests: Recent Labs  Lab 06/10/24 1403  AST 42*  ALT 82*  ALKPHOS 122  BILITOT 0.4  PROT 6.3*  ALBUMIN 3.8   No results for input(s): LIPASE, AMYLASE in the last 168 hours. No results for input(s): AMMONIA in the last 168 hours. CBC: Recent Labs  Lab 06/10/24 1403 06/11/24 0516  WBC 9.1 7.5  NEUTROABS 6.5  --   HGB 12.5* 12.1*  HCT 36.5* 35.8*  MCV 89.2 90.2  PLT 208 207   Cardiac Enzymes: No results for input(s): CKTOTAL, CKMB, CKMBINDEX, TROPONINI in the last 168 hours. BNP: Invalid input(s): POCBNP CBG: No results for input(s): GLUCAP in the last 168 hours. D-Dimer  No results for input(s): DDIMER in the last 72 hours. Hgb A1c No results for input(s): HGBA1C in the last 72 hours. Lipid Profile No results for input(s): CHOL, HDL, LDLCALC, TRIG, CHOLHDL, LDLDIRECT in the last 72 hours. Thyroid function studies No results for input(s): TSH, T4TOTAL, T3FREE, THYROIDAB in the last 72 hours.  Invalid input(s): FREET3 Anemia work up No results for input(s): VITAMINB12, FOLATE, FERRITIN, TIBC, IRON, RETICCTPCT in the last 72 hours. Urinalysis    Component Value Date/Time   COLORURINE YELLOW (A) 03/17/2023 0744   APPEARANCEUR Clear 08/09/2023 1508   LABSPEC 1.021 03/17/2023 0744   LABSPEC 1.023 01/28/2014 2158   PHURINE 5.0 03/17/2023 0744   GLUCOSEU Negative 08/09/2023 1508   GLUCOSEU Negative 01/28/2014 2158    HGBUR MODERATE (A) 03/17/2023 0744   BILIRUBINUR Negative 08/09/2023 1508   BILIRUBINUR Negative 01/28/2014 2158   KETONESUR NEGATIVE 03/17/2023 0744   PROTEINUR Negative 08/09/2023 1508   PROTEINUR NEGATIVE 03/17/2023 0744   UROBILINOGEN 0.2 05/06/2015 1001   NITRITE Negative 08/09/2023 1508   NITRITE NEGATIVE 03/17/2023 0744   LEUKOCYTESUR Negative 08/09/2023 1508   LEUKOCYTESUR NEGATIVE 03/17/2023 0744   LEUKOCYTESUR 1+ 01/28/2014 2158   Sepsis Labs Recent Labs  Lab 06/10/24 1403 06/11/24 0516  WBC 9.1 7.5   Microbiology Recent Results (from the past 240 hours)  Resp panel by RT-PCR (RSV, Flu A&B, Covid) Anterior Nasal Swab     Status: None   Collection Time: 06/10/24  2:03 PM   Specimen: Anterior Nasal Swab  Result Value Ref Range Status   SARS Coronavirus 2 by RT PCR NEGATIVE NEGATIVE Final    Comment: (NOTE) SARS-CoV-2 target nucleic acids are NOT DETECTED.  The SARS-CoV-2 RNA is generally detectable in upper respiratory specimens during the acute phase of infection. The lowest concentration of SARS-CoV-2 viral copies this assay can detect is 138 copies/mL. A negative result does not preclude SARS-Cov-2 infection and should not be used as the sole basis for treatment or other patient management decisions. A negative result may occur with  improper specimen collection/handling, submission of specimen other than nasopharyngeal swab, presence of viral mutation(s) within the areas targeted by this assay, and inadequate number of viral copies(<138 copies/mL). A negative result must be combined with clinical observations, patient history, and epidemiological information. The expected result is Negative.  Fact Sheet for Patients:  bloggercourse.com  Fact Sheet for Healthcare Providers:  seriousbroker.it  This test is no t yet approved or cleared by the [place]  FDA and  has been authorized for detection and/or  diagnosis of SARS-CoV-2 by FDA under an Emergency Use Authorization (EUA). This EUA will remain  in effect (meaning this test can be used) for the duration of the COVID-19 declaration under Section 564(b)(1) of the Act, 21 U.S.C.section 360bbb-3(b)(1), unless the authorization is terminated  or revoked sooner.       Influenza A by PCR NEGATIVE NEGATIVE Final   Influenza B by PCR NEGATIVE NEGATIVE Final    Comment: (NOTE) The Xpert Xpress SARS-CoV-2/FLU/RSV plus assay is intended as an aid in the diagnosis of influenza from Nasopharyngeal swab specimens and should not be used as a sole basis for treatment. Nasal washings and aspirates are unacceptable for Xpert Xpress SARS-CoV-2/FLU/RSV testing.  Fact Sheet for Patients: bloggercourse.com  Fact Sheet for Healthcare Providers: seriousbroker.it  This test is not yet approved or cleared by the [place]  FDA and has been authorized for detection and/or diagnosis of SARS-CoV-2 by FDA under an Emergency Use Authorization (EUA). This  EUA will remain in effect (meaning this test can be used) for the duration of the COVID-19 declaration under Section 564(b)(1) of the Act, 21 U.S.C. section 360bbb-3(b)(1), unless the authorization is terminated or revoked.     Resp Syncytial Virus by PCR NEGATIVE NEGATIVE Final    Comment: (NOTE) Fact Sheet for Patients: bloggercourse.com  Fact Sheet for Healthcare Providers: seriousbroker.it  This test is not yet approved or cleared by the [place]  FDA and has been authorized for detection and/or diagnosis of SARS-CoV-2 by FDA under an Emergency Use Authorization (EUA). This EUA will remain in effect (meaning this test can be used) for the duration of the COVID-19 declaration under Section 564(b)(1) of the Act, 21 U.S.C. section 360bbb-3(b)(1), unless the authorization is terminated  or revoked.  Performed at [facility], [address] [place], [place] [address]   Group A Strep by PCR [facility] Only)     Status: None   Collection Time: 06/10/24  2:03 PM   Specimen: Anterior Nasal Swab; Sterile Swab  Result Value Ref Range Status   Group A Strep by PCR NOT DETECTED NOT DETECTED Final    Comment: Performed at [facility], [address] [place], [place] [address]     Time coordinating discharge: 40 minutes  SIGNED:   Calvin KATHEE Robson, MD  [facility] 06/11/2024, 4:36 PM Pager   If 7PM-7AM, please contact night-coverage

## 2024-06-11 NOTE — Interval H&P Note (Signed)
 History and Physical Interval Note:  06/11/2024 1:18 PM  Brian Spence  has presented today for surgery, with the diagnosis of DVT LUE.  The various methods of treatment have been discussed with the patient and family. After consideration of risks, benefits and other options for treatment, the patient has consented to  Procedure(s): PERIPHERAL VASCULAR THROMBECTOMY (Left) as a surgical intervention.  The patient's history has been reviewed, patient examined, no change in status, stable for surgery.  I have reviewed the patient's chart and labs.  Questions were answered to the patient's satisfaction.     [name]

## 2024-06-11 NOTE — TOC Initial Note (Addendum)
 Transition of Care [facility]) - Initial/Assessment Note    Patient Details  Name: Brian Spence MRN: [id number] Date of Birth: [birth year]  Transition of Care [facility]) CM/SW Contact:    Iseah Plouff L Naraly Fritcher, LCSW Phone Number: 06/11/2024, 9:32 AM  Clinical Narrative:                    Secure chat received regarding patients medicaid. Patient has family planning medicaid. CSW sent a secure chat to [employer] to determine eligibility for special assistance Medicaid.   Resources for the Affordable Care Act have been uploaded onto the AVS.   1:12pm: Patients medicaid is not showing active for this month. His acct has been sent to [employer]. They will be following up with him to screen him for medicaid.      Patient Goals and CMS Choice            Expected Discharge Plan and Services                                              Prior Living Arrangements/Services                       Activities of Daily Living   ADL Screening (condition at time of admission) Independently performs ADLs?: Yes (appropriate for developmental age) Is the patient deaf or have difficulty hearing?: No Does the patient have difficulty seeing, even when wearing glasses/contacts?: No Does the patient have difficulty concentrating, remembering, or making decisions?: No  Permission Sought/Granted                  Emotional Assessment              Admission diagnosis:  Occlusion of left subclavian vein (HCC) [email or web address].[id number]] Acute deep vein thrombosis (DVT) of left upper extremity (HCC) [I82.622] Acute deep vein thrombosis (DVT) of axillary vein of left upper extremity (HCC) DAR.[id number].[id number]] Patient Active Problem List   Diagnosis Date Noted   Acute deep vein thrombosis (DVT) of left upper extremity (HCC) 06/10/2024   Pain and swelling of left upper extremity 06/10/2024   Tobacco use 06/10/2024   MDD (major depressive disorder), single episode, moderate (HCC) 08/07/2019    Generalized anxiety disorder 05/21/2019   S/P arthroscopic surgery of left knee 07/09/2018   Chondromalacia of left patella 07/07/2018   Impingement syndrome involving patellar fat pad of left knee 07/07/2018   Plica syndrome, left knee 07/07/2018   Left knee pain 05/08/2018   Hypertrophy of both inferior nasal turbinates 11/26/2016   Nasal septal deviation 11/26/2016   Anxiety and depression 09/20/2014   Lumbar paraspinal muscle spasm 09/02/2014   Lumbar radiculitis 09/02/2014   Fracture of zygomaticomaxillary complex (HCC) 03/11/2014   Orbital floor fracture (HCC) 02/09/2014   Sinusitis, chronic 10/31/2013   Panic attacks 06/21/2011   Exercise-induced asthma    PCP:  Tobie Domino, MD Pharmacy:   [facility] [id number] - [place], [place] - [address] [place] [address] Phone: [phone] Fax: [phone]  [facility] [address] ([address]),  - [address] [place]) [place] [address] Phone: [phone] Fax: [phone]  [facility] [address] [place] [address] Phone: [phone] Fax: [phone]     Social Drivers of Health (SDOH) Social History:  SDOH Screenings   Food Insecurity: No Food Insecurity (06/10/2024)  Housing: Low Risk  (06/10/2024)  Transportation Needs: No Transportation Needs (06/10/2024)  Utilities: Not At Risk (06/10/2024)  Depression (PHQ2-9): Medium Risk (08/01/2019)  Financial Resource Strain: Medium Risk (05/21/2019)  Physical Activity: Inactive (05/21/2019)  Social Connections: Unknown (05/21/2019)  Stress: Stress Concern Present (05/21/2019)  Tobacco Use: High Risk (06/10/2024)   SDOH Interventions:     Readmission Risk Interventions     No data to display

## 2024-06-11 NOTE — Plan of Care (Signed)

## 2024-06-11 NOTE — H&P (View-Only) (Signed)
 Progress Note    06/11/2024 8:17 AM * No surgery found *  Subjective:  Brian Spence is a 33 yo male who presents to [facility] with left upper extremity swelling which got progressively worse over the past week. Patient endorses he has had on and off swelling for the past year. Endorses pain today.   Upon work up noted to have a left subclavia and axilla DVT. Patient was started on heparin and continues on infusion. No complaints and vitals all remain stable.    Vitals:   06/11/24 0417 06/11/24 0502  BP: 111/65 (!) 111/55  Pulse: 75 (!) 55  Resp: 15 16  Temp: (!) 102 F (38.9 C) 99.2 F (37.3 C)  SpO2: 96% 94%   Physical Exam: Cardiac:  RRR, normal S1, S2. No murmurs Lungs:  Clear throughout on auscultation, non labored, no rales rhonchi or wheezing.  Incisions:  None  Extremities:  All extremity with palpable pulses and warm to touch. Left upper extremity with +2 edema this morning.  Abdomen:  Positive bowel sounds throughout, soft, non tender and non distended.  Neurologic: AAOX 3 answers all questions and follows commands.   CBC    Component Value Date/Time   WBC 7.5 06/11/2024 0516   RBC 3.97 (L) 06/11/2024 0516   HGB 12.1 (L) 06/11/2024 0516   HCT 35.8 (L) 06/11/2024 0516   HCT 41 08/21/2010 0000   PLT 207 06/11/2024 0516   MCV 90.2 06/11/2024 0516   MCH 30.5 06/11/2024 0516   MCHC 33.8 06/11/2024 0516   RDW 12.4 06/11/2024 0516   LYMPHSABS 1.4 06/10/2024 1403   MONOABS 1.0 06/10/2024 1403   EOSABS 0.2 06/10/2024 1403   BASOSABS 0.0 06/10/2024 1403    BMET    Component Value Date/Time   NA 138 06/11/2024 0516   NA 138 08/21/2010 0000   K 3.9 06/11/2024 0516   K 4.0 08/21/2010 0000   CL 105 06/11/2024 0516   CL 103 08/21/2010 0000   CO2 25 06/11/2024 0516   CO2 22 08/21/2010 0000   GLUCOSE 102 (H) 06/11/2024 0516   BUN 16 06/11/2024 0516   BUN 13 08/21/2010 0000   CREATININE 0.89 06/11/2024 0516   CREATININE 0.78 08/21/2010 0000   CALCIUM 8.4 (L)  06/11/2024 0516   CALCIUM 9.2 08/21/2010 0000   GFRNONAA >60 06/11/2024 0516   GFRAA >60 08/04/2017 1418    INR    Component Value Date/Time   INR 1.0 06/10/2024 1403     Intake/Output Summary (Last 24 hours) at 06/11/2024 0817 Last data filed at 06/11/2024 0412 Gross per 24 hour  Intake 233.35 ml  Output --  Net 233.35 ml     Assessment/Plan:  33 y.o. male presents to [facility] emergency room with left upper extremity swelling +2 for about a week. Swelling and pain worsened over the past 2 days. Upon work up patient noted to have a left subclavia and axillary DVT.  * No surgery found *   Vascular surgery plans on taking the patient to the vascular lab for left upper extremity thrombectomy with possible intervention later today.  I do long detailed discussion at the bedside this morning with the patient and his mother and father concerning the procedure, benefits, risk, and complications.  We also discussed in detail his need for long-term anticoagulation for at a minimum 6 months to possibly a year.  They verbalized her understanding and wished to proceed with the procedure.  I answered all her questions this morning.  Patient's been n.p.o. since midnight last night.  The patient remains on heparin infusion until the procedure.  Patient's labs are all within normal limits.  I discussed the case in detail with Dr. Selinda Gu MD and he agrees with plan.  DVT prophylaxis: Heparin infusion   Gwendlyn JONELLE Shank [facility] 06/11/2024 8:17 AM
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
## 2024-06-11 NOTE — Plan of Care (Signed)
  Problem: Education: Goal: Knowledge of General Education information will improve Description: Including pain rating scale, medication(s)/side effects and non-pharmacologic comfort measures 06/11/2024 1746 by Leonce Norene BIRCH, RN Outcome: Adequate for Discharge 06/11/2024 1614 by Leonce Norene BIRCH, RN Outcome: Progressing   Problem: Health Behavior/Discharge Planning: Goal: Ability to manage health-related needs will improve 06/11/2024 1746 by Leonce Norene BIRCH, RN Outcome: Adequate for Discharge 06/11/2024 1614 by Leonce Norene BIRCH, RN Outcome: Progressing   Problem: Clinical Measurements: Goal: Ability to maintain clinical measurements within normal limits will improve 06/11/2024 1746 by Leonce Norene BIRCH, RN Outcome: Adequate for Discharge 06/11/2024 1614 by Leonce Norene BIRCH, RN Outcome: Progressing Goal: Will remain free from infection 06/11/2024 1746 by Leonce Norene BIRCH, RN Outcome: Adequate for Discharge 06/11/2024 1614 by Leonce Norene BIRCH, RN Outcome: Progressing Goal: Diagnostic test results will improve 06/11/2024 1746 by Leonce Norene BIRCH, RN Outcome: Adequate for Discharge 06/11/2024 1614 by Leonce Norene BIRCH, RN Outcome: Progressing Goal: Respiratory complications will improve 06/11/2024 1746 by Leonce Norene BIRCH, RN Outcome: Adequate for Discharge 06/11/2024 1614 by Leonce Norene BIRCH, RN Outcome: Progressing Goal: Cardiovascular complication will be avoided 06/11/2024 1746 by Leonce Norene BIRCH, RN Outcome: Adequate for Discharge 06/11/2024 1614 by Leonce Norene BIRCH, RN Outcome: Progressing   Problem: Activity: Goal: Risk for activity intolerance will decrease 06/11/2024 1746 by Leonce Norene BIRCH, RN Outcome: Adequate for Discharge 06/11/2024 1614 by Leonce Norene BIRCH, RN Outcome: Progressing   Problem: Nutrition: Goal: Adequate nutrition will be maintained 06/11/2024 1746 by Leonce Norene BIRCH, RN Outcome: Adequate for Discharge 06/11/2024 1614 by Leonce Norene BIRCH, RN Outcome: Progressing   Problem: Coping: Goal: Level of anxiety will decrease 06/11/2024 1746 by Leonce Norene BIRCH, RN Outcome: Adequate for Discharge 06/11/2024 1614 by Leonce Norene BIRCH, RN Outcome: Progressing   Problem: Elimination: Goal: Will not experience complications related to bowel motility 06/11/2024 1746 by Leonce Norene BIRCH, RN Outcome: Adequate for Discharge 06/11/2024 1614 by Leonce Norene BIRCH, RN Outcome: Progressing Goal: Will not experience complications related to urinary retention 06/11/2024 1746 by Leonce Norene BIRCH, RN Outcome: Adequate for Discharge 06/11/2024 1614 by Leonce Norene BIRCH, RN Outcome: Progressing   Problem: Pain Managment: Goal: General experience of comfort will improve and/or be controlled 06/11/2024 1746 by Leonce Norene BIRCH, RN Outcome: Adequate for Discharge 06/11/2024 1614 by Leonce Norene BIRCH, RN Outcome: Progressing   Problem: Safety: Goal: Ability to remain free from injury will improve 06/11/2024 1746 by Leonce Norene BIRCH, RN Outcome: Adequate for Discharge 06/11/2024 1614 by Leonce Norene BIRCH, RN Outcome: Progressing   Problem: Skin Integrity: Goal: Risk for impaired skin integrity will decrease 06/11/2024 1746 by Leonce Norene BIRCH, RN Outcome: Adequate for Discharge 06/11/2024 1614 by Leonce Norene BIRCH, RN Outcome: Progressing

## 2024-06-12 ENCOUNTER — Telehealth (INDEPENDENT_AMBULATORY_CARE_PROVIDER_SITE_OTHER): Payer: Self-pay

## 2024-06-12 ENCOUNTER — Other Ambulatory Visit: Payer: Self-pay

## 2024-06-12 LAB — MISC LABCORP TEST (SEND OUT): Labcorp test code: 83935

## 2024-06-12 NOTE — Telephone Encounter (Signed)
 Patient called nurse line at this time questioning if he could take off his post-surgery bandages(peripheral vascular thrombectomy), 06/11/24 he stated they are tight and uncomfortable at this time. Please advise.

## 2024-06-12 NOTE — Telephone Encounter (Signed)
 Spoke with patient at this time and advised him it was okay to remove his dressing at this time, advised him to call back with any further questions or concerns.

## 2024-06-12 NOTE — Telephone Encounter (Signed)
 Yes that is fine

## 2024-06-13 ENCOUNTER — Other Ambulatory Visit: Payer: Self-pay

## 2024-06-19 ENCOUNTER — Other Ambulatory Visit: Payer: Self-pay

## 2024-06-20 ENCOUNTER — Other Ambulatory Visit: Payer: Self-pay

## 2024-07-03 ENCOUNTER — Telehealth (INDEPENDENT_AMBULATORY_CARE_PROVIDER_SITE_OTHER): Payer: Self-pay

## 2024-07-03 ENCOUNTER — Other Ambulatory Visit: Payer: Self-pay

## 2024-07-03 NOTE — Telephone Encounter (Signed)
 Patient reach out to the office checking on the status of his referral stating that [facility] has not received the referral. Patient was advised that referral has been resent today. Patient states since he had peripheral vascular thrombectomy he is experiencing left arm swelling, tightness, hand weakness not able to grip, tingling,and burning sensation. On Saturday his right arm began to swell and elbow discoloration of red, and bluish purplish color that only lasted that day. Also he experience left leg swelling, weakness, and pain on Saturday. Since the procedure he is having night sweats. On Sunday the left side of his head and neck felt cold and tingling that lasted a couple of minutes. Patient started having chest heaviness on the left side. Provider was notified with patient concerns and Orvin NP recommended that patient should be seen in ED for further evaluation. Patient verbalized understanding to medical recommendations.
